# Patient Record
Sex: Female | Born: 1935 | Race: White | Hispanic: No | Marital: Married | Smoking: Former smoker
Health system: Southern US, Community
[De-identification: ages and names within clinical notes are randomized; demographics above are authoritative.]

## PROBLEM LIST (undated history)

## (undated) DIAGNOSIS — M8580 Other specified disorders of bone density and structure, unspecified site: Secondary | ICD-10-CM

## (undated) DIAGNOSIS — IMO0001 Reserved for inherently not codable concepts without codable children: Secondary | ICD-10-CM

## (undated) DIAGNOSIS — I10 Essential (primary) hypertension: Secondary | ICD-10-CM

## (undated) DIAGNOSIS — I272 Pulmonary hypertension, unspecified: Secondary | ICD-10-CM

## (undated) DIAGNOSIS — H9193 Unspecified hearing loss, bilateral: Secondary | ICD-10-CM

## (undated) DIAGNOSIS — N6459 Other signs and symptoms in breast: Secondary | ICD-10-CM

## (undated) DIAGNOSIS — I872 Venous insufficiency (chronic) (peripheral): Secondary | ICD-10-CM

## (undated) DIAGNOSIS — I340 Nonrheumatic mitral (valve) insufficiency: Secondary | ICD-10-CM

## (undated) DIAGNOSIS — J4520 Mild intermittent asthma, uncomplicated: Secondary | ICD-10-CM

## (undated) DIAGNOSIS — IMO0002 Reserved for concepts with insufficient information to code with codable children: Secondary | ICD-10-CM

## (undated) DIAGNOSIS — I447 Left bundle-branch block, unspecified: Secondary | ICD-10-CM

## (undated) DIAGNOSIS — K56609 Unspecified intestinal obstruction, unspecified as to partial versus complete obstruction: Secondary | ICD-10-CM

## (undated) DIAGNOSIS — N6019 Diffuse cystic mastopathy of unspecified breast: Secondary | ICD-10-CM

## (undated) DIAGNOSIS — E7800 Pure hypercholesterolemia, unspecified: Secondary | ICD-10-CM

## (undated) HISTORY — PX: COLON SURGERY: SHX602

## (undated) HISTORY — DX: Venous insufficiency (chronic) (peripheral): I87.2

## (undated) HISTORY — DX: Other signs and symptoms in breast: N64.59

## (undated) HISTORY — DX: Pure hypercholesterolemia, unspecified: E78.00

## (undated) HISTORY — DX: Mild intermittent asthma, uncomplicated: J45.20

## (undated) HISTORY — DX: Reserved for concepts with insufficient information to code with codable children: IMO0002

## (undated) HISTORY — DX: Nonrheumatic mitral (valve) insufficiency: I34.0

## (undated) HISTORY — DX: Diffuse cystic mastopathy of unspecified breast: N60.19

## (undated) HISTORY — DX: Left bundle-branch block, unspecified: I44.7

## (undated) HISTORY — PX: HERNIA REPAIR: SHX51

## (undated) HISTORY — DX: Reserved for inherently not codable concepts without codable children: IMO0001

## (undated) HISTORY — DX: Essential (primary) hypertension: I10

## (undated) HISTORY — DX: Other specified disorders of bone density and structure, unspecified site: M85.80

## (undated) HISTORY — DX: Unspecified intestinal obstruction, unspecified as to partial versus complete obstruction: K56.609

## (undated) HISTORY — DX: Unspecified hearing loss, bilateral: H91.93

## (undated) HISTORY — DX: Pulmonary hypertension, unspecified: I27.20

---

## 1997-12-17 HISTORY — PX: BREAST EXCISIONAL BIOPSY: SUR124

## 1998-04-11 ENCOUNTER — Other Ambulatory Visit: Admission: RE | Admit: 1998-04-11 | Discharge: 1998-04-11 | Payer: Self-pay | Admitting: Family Medicine

## 1998-07-11 ENCOUNTER — Ambulatory Visit (HOSPITAL_COMMUNITY): Admission: RE | Admit: 1998-07-11 | Discharge: 1998-07-11 | Payer: Self-pay

## 2000-08-05 ENCOUNTER — Encounter: Payer: Self-pay | Admitting: Family Medicine

## 2000-08-05 ENCOUNTER — Encounter: Admission: RE | Admit: 2000-08-05 | Discharge: 2000-08-05 | Payer: Self-pay | Admitting: Family Medicine

## 2001-07-31 ENCOUNTER — Encounter: Admission: RE | Admit: 2001-07-31 | Discharge: 2001-07-31 | Payer: Self-pay | Admitting: Family Medicine

## 2001-07-31 ENCOUNTER — Encounter: Payer: Self-pay | Admitting: Family Medicine

## 2001-11-19 ENCOUNTER — Encounter: Payer: Self-pay | Admitting: Family Medicine

## 2001-11-19 ENCOUNTER — Encounter: Admission: RE | Admit: 2001-11-19 | Discharge: 2001-11-19 | Payer: Self-pay | Admitting: Family Medicine

## 2001-12-05 ENCOUNTER — Ambulatory Visit (HOSPITAL_COMMUNITY): Admission: RE | Admit: 2001-12-05 | Discharge: 2001-12-05 | Payer: Self-pay | Admitting: Urology

## 2001-12-05 ENCOUNTER — Encounter: Payer: Self-pay | Admitting: Urology

## 2001-12-17 HISTORY — PX: NEPHRECTOMY: SHX65

## 2001-12-17 HISTORY — PX: CHOLECYSTECTOMY: SHX55

## 2001-12-17 HISTORY — PX: COLECTOMY: SHX59

## 2001-12-17 HISTORY — PX: APPENDECTOMY: SHX54

## 2001-12-23 ENCOUNTER — Encounter (INDEPENDENT_AMBULATORY_CARE_PROVIDER_SITE_OTHER): Payer: Self-pay | Admitting: Specialist

## 2001-12-23 ENCOUNTER — Ambulatory Visit (HOSPITAL_COMMUNITY): Admission: RE | Admit: 2001-12-23 | Discharge: 2001-12-23 | Payer: Self-pay | Admitting: Gastroenterology

## 2001-12-23 ENCOUNTER — Encounter (INDEPENDENT_AMBULATORY_CARE_PROVIDER_SITE_OTHER): Payer: Self-pay

## 2001-12-26 ENCOUNTER — Encounter: Payer: Self-pay | Admitting: Surgery

## 2001-12-29 ENCOUNTER — Encounter: Admission: RE | Admit: 2001-12-29 | Discharge: 2001-12-29 | Payer: Self-pay | Admitting: Surgery

## 2001-12-29 ENCOUNTER — Encounter: Payer: Self-pay | Admitting: Surgery

## 2002-01-02 ENCOUNTER — Inpatient Hospital Stay (HOSPITAL_COMMUNITY): Admission: RE | Admit: 2002-01-02 | Discharge: 2002-01-09 | Payer: Self-pay | Admitting: Surgery

## 2002-01-02 ENCOUNTER — Encounter (INDEPENDENT_AMBULATORY_CARE_PROVIDER_SITE_OTHER): Payer: Self-pay

## 2002-08-04 ENCOUNTER — Encounter: Admission: RE | Admit: 2002-08-04 | Discharge: 2002-08-04 | Payer: Self-pay | Admitting: Family Medicine

## 2002-08-04 ENCOUNTER — Encounter: Payer: Self-pay | Admitting: Family Medicine

## 2002-12-17 HISTORY — PX: CATARACT EXTRACTION: SUR2

## 2003-08-06 ENCOUNTER — Encounter: Admission: RE | Admit: 2003-08-06 | Discharge: 2003-08-06 | Payer: Self-pay | Admitting: Family Medicine

## 2003-08-06 ENCOUNTER — Encounter: Payer: Self-pay | Admitting: Family Medicine

## 2004-03-17 ENCOUNTER — Other Ambulatory Visit: Admission: RE | Admit: 2004-03-17 | Discharge: 2004-03-17 | Payer: Self-pay | Admitting: Obstetrics and Gynecology

## 2004-08-07 ENCOUNTER — Ambulatory Visit (HOSPITAL_COMMUNITY): Admission: RE | Admit: 2004-08-07 | Discharge: 2004-08-07 | Payer: Self-pay | Admitting: Family Medicine

## 2005-02-19 ENCOUNTER — Encounter: Admission: RE | Admit: 2005-02-19 | Discharge: 2005-02-19 | Payer: Self-pay | Admitting: Family Medicine

## 2005-08-08 ENCOUNTER — Ambulatory Visit (HOSPITAL_COMMUNITY): Admission: RE | Admit: 2005-08-08 | Discharge: 2005-08-08 | Payer: Self-pay | Admitting: Family Medicine

## 2005-12-26 ENCOUNTER — Encounter: Admission: RE | Admit: 2005-12-26 | Discharge: 2005-12-26 | Payer: Self-pay | Admitting: Family Medicine

## 2006-08-09 ENCOUNTER — Ambulatory Visit (HOSPITAL_COMMUNITY): Admission: RE | Admit: 2006-08-09 | Discharge: 2006-08-09 | Payer: Self-pay | Admitting: Family Medicine

## 2006-11-01 ENCOUNTER — Other Ambulatory Visit: Admission: RE | Admit: 2006-11-01 | Discharge: 2006-11-01 | Payer: Self-pay | Admitting: Obstetrics and Gynecology

## 2006-11-28 ENCOUNTER — Encounter: Admission: RE | Admit: 2006-11-28 | Discharge: 2006-11-28 | Payer: Self-pay | Admitting: Family Medicine

## 2007-08-11 ENCOUNTER — Ambulatory Visit (HOSPITAL_COMMUNITY): Admission: RE | Admit: 2007-08-11 | Discharge: 2007-08-11 | Payer: Self-pay | Admitting: Family Medicine

## 2007-10-02 HISTORY — PX: CARDIAC CATHETERIZATION: SHX172

## 2008-07-20 ENCOUNTER — Encounter: Admission: RE | Admit: 2008-07-20 | Discharge: 2008-07-20 | Payer: Self-pay | Admitting: Orthopedic Surgery

## 2008-08-11 ENCOUNTER — Ambulatory Visit (HOSPITAL_COMMUNITY): Admission: RE | Admit: 2008-08-11 | Discharge: 2008-08-11 | Payer: Self-pay | Admitting: Family Medicine

## 2009-08-12 ENCOUNTER — Ambulatory Visit (HOSPITAL_COMMUNITY): Admission: RE | Admit: 2009-08-12 | Discharge: 2009-08-12 | Payer: Self-pay | Admitting: Family Medicine

## 2009-09-06 ENCOUNTER — Ambulatory Visit: Payer: Self-pay | Admitting: Obstetrics and Gynecology

## 2009-09-06 ENCOUNTER — Encounter: Payer: Self-pay | Admitting: Obstetrics and Gynecology

## 2009-09-06 ENCOUNTER — Other Ambulatory Visit: Admission: RE | Admit: 2009-09-06 | Discharge: 2009-09-06 | Payer: Self-pay | Admitting: Obstetrics and Gynecology

## 2009-09-26 ENCOUNTER — Ambulatory Visit: Payer: Self-pay | Admitting: Obstetrics and Gynecology

## 2010-02-15 ENCOUNTER — Ambulatory Visit: Payer: Self-pay | Admitting: Obstetrics and Gynecology

## 2010-05-02 ENCOUNTER — Encounter: Admission: RE | Admit: 2010-05-02 | Discharge: 2010-05-02 | Payer: Self-pay | Admitting: Family Medicine

## 2010-08-14 ENCOUNTER — Ambulatory Visit (HOSPITAL_COMMUNITY): Admission: RE | Admit: 2010-08-14 | Discharge: 2010-08-14 | Payer: Self-pay | Admitting: Family Medicine

## 2010-09-12 ENCOUNTER — Ambulatory Visit: Payer: Self-pay | Admitting: Obstetrics and Gynecology

## 2011-01-17 HISTORY — PX: TRANSTHORACIC ECHOCARDIOGRAM: SHX275

## 2011-01-17 HISTORY — PX: NM MYOCAR PERF WALL MOTION: HXRAD629

## 2011-02-21 ENCOUNTER — Other Ambulatory Visit: Payer: Self-pay | Admitting: Family Medicine

## 2011-02-21 DIAGNOSIS — R0989 Other specified symptoms and signs involving the circulatory and respiratory systems: Secondary | ICD-10-CM

## 2011-02-22 ENCOUNTER — Ambulatory Visit
Admission: RE | Admit: 2011-02-22 | Discharge: 2011-02-22 | Disposition: A | Discharge status: Home / Self Care | Payer: Medicare Other | Source: Ambulatory Visit | Attending: Family Medicine | Admitting: Family Medicine

## 2011-02-22 DIAGNOSIS — R0989 Other specified symptoms and signs involving the circulatory and respiratory systems: Secondary | ICD-10-CM

## 2011-03-08 ENCOUNTER — Other Ambulatory Visit: Payer: Self-pay | Admitting: Orthopedic Surgery

## 2011-03-08 ENCOUNTER — Encounter (HOSPITAL_COMMUNITY): Payer: Medicare Other

## 2011-03-08 ENCOUNTER — Other Ambulatory Visit (HOSPITAL_COMMUNITY): Payer: Self-pay | Admitting: Orthopedic Surgery

## 2011-03-08 ENCOUNTER — Ambulatory Visit (HOSPITAL_COMMUNITY)
Admission: RE | Admit: 2011-03-08 | Discharge: 2011-03-08 | Disposition: A | Discharge status: Home / Self Care | Payer: Medicare Other | Source: Ambulatory Visit | Attending: Orthopedic Surgery | Admitting: Orthopedic Surgery

## 2011-03-08 DIAGNOSIS — Z79899 Other long term (current) drug therapy: Secondary | ICD-10-CM | POA: Insufficient documentation

## 2011-03-08 DIAGNOSIS — M069 Rheumatoid arthritis, unspecified: Secondary | ICD-10-CM | POA: Insufficient documentation

## 2011-03-08 DIAGNOSIS — H269 Unspecified cataract: Secondary | ICD-10-CM | POA: Insufficient documentation

## 2011-03-08 DIAGNOSIS — M1710 Unilateral primary osteoarthritis, unspecified knee: Secondary | ICD-10-CM | POA: Insufficient documentation

## 2011-03-08 DIAGNOSIS — I517 Cardiomegaly: Secondary | ICD-10-CM | POA: Insufficient documentation

## 2011-03-08 DIAGNOSIS — M25569 Pain in unspecified knee: Secondary | ICD-10-CM | POA: Insufficient documentation

## 2011-03-08 DIAGNOSIS — I4891 Unspecified atrial fibrillation: Secondary | ICD-10-CM | POA: Insufficient documentation

## 2011-03-08 DIAGNOSIS — Z01811 Encounter for preprocedural respiratory examination: Secondary | ICD-10-CM

## 2011-03-08 DIAGNOSIS — E785 Hyperlipidemia, unspecified: Secondary | ICD-10-CM | POA: Insufficient documentation

## 2011-03-08 DIAGNOSIS — I10 Essential (primary) hypertension: Secondary | ICD-10-CM | POA: Insufficient documentation

## 2011-03-08 LAB — URINALYSIS, ROUTINE W REFLEX MICROSCOPIC
Bilirubin Urine: NEGATIVE
Hgb urine dipstick: NEGATIVE
Nitrite: NEGATIVE
Protein, ur: NEGATIVE mg/dL
Urobilinogen, UA: 0.2 mg/dL (ref 0.0–1.0)

## 2011-03-08 LAB — COMPREHENSIVE METABOLIC PANEL
CO2: 27 mEq/L (ref 19–32)
Calcium: 9.6 mg/dL (ref 8.4–10.5)
Creatinine, Ser: 0.85 mg/dL (ref 0.4–1.2)
GFR calc Af Amer: >60 mL/min (ref >60)
GFR calc non Af Amer: >60 mL/min (ref >60)
Glucose, Bld: 104 mg/dL — ABNORMAL HIGH (ref 70–99)
Total Protein: 7.5 g/dL (ref 6.0–8.3)

## 2011-03-08 LAB — SURGICAL PCR SCREEN: Staphylococcus aureus: NEGATIVE

## 2011-03-08 LAB — PROTIME-INR: Prothrombin Time: 13.6 seconds (ref 11.6–15.2)

## 2011-03-08 LAB — APTT: aPTT: 36 seconds (ref 24–37)

## 2011-03-08 LAB — CBC
HCT: 39.8 % (ref 36.0–46.0)
MCHC: 33.2 g/dL (ref 30.0–36.0)
Platelets: 307 10*3/uL (ref 150–400)
RDW: 13.3 % (ref 11.5–15.5)
WBC: 6.2 10*3/uL (ref 4.0–10.5)

## 2011-03-16 ENCOUNTER — Inpatient Hospital Stay (HOSPITAL_COMMUNITY)
Admission: RE | Admit: 2011-03-16 | Discharge: 2011-03-19 | DRG: 470 | Disposition: A | Discharge status: Skilled Nursing Facility | Payer: Medicare Other | Source: Ambulatory Visit | Attending: Orthopedic Surgery | Admitting: Orthopedic Surgery

## 2011-03-16 DIAGNOSIS — E785 Hyperlipidemia, unspecified: Secondary | ICD-10-CM | POA: Diagnosis present

## 2011-03-16 DIAGNOSIS — M069 Rheumatoid arthritis, unspecified: Secondary | ICD-10-CM | POA: Diagnosis present

## 2011-03-16 DIAGNOSIS — M1710 Unilateral primary osteoarthritis, unspecified knee: Principal | ICD-10-CM | POA: Diagnosis present

## 2011-03-16 DIAGNOSIS — D62 Acute posthemorrhagic anemia: Secondary | ICD-10-CM | POA: Diagnosis not present

## 2011-03-16 DIAGNOSIS — I4891 Unspecified atrial fibrillation: Secondary | ICD-10-CM | POA: Diagnosis present

## 2011-03-16 DIAGNOSIS — I10 Essential (primary) hypertension: Secondary | ICD-10-CM | POA: Diagnosis present

## 2011-03-16 DIAGNOSIS — Z01812 Encounter for preprocedural laboratory examination: Secondary | ICD-10-CM

## 2011-03-16 HISTORY — PX: TOTAL KNEE ARTHROPLASTY: SHX125

## 2011-03-17 LAB — CBC
HCT: 33.2 % — ABNORMAL LOW (ref 36.0–46.0)
Hemoglobin: 10.8 g/dL — ABNORMAL LOW (ref 12.0–15.0)
MCH: 31.5 pg (ref 26.0–34.0)
MCV: 96.8 fL (ref 78.0–100.0)
RBC: 3.43 MIL/uL — ABNORMAL LOW (ref 3.87–5.11)

## 2011-03-17 LAB — BASIC METABOLIC PANEL
BUN: 13 mg/dL (ref 6–23)
CO2: 26 mEq/L (ref 19–32)
Chloride: 105 mEq/L (ref 96–112)
Creatinine, Ser: 0.74 mg/dL (ref 0.4–1.2)
Glucose, Bld: 138 mg/dL — ABNORMAL HIGH (ref 70–99)

## 2011-03-18 LAB — BASIC METABOLIC PANEL
BUN: 9 mg/dL (ref 6–23)
Chloride: 106 mEq/L (ref 96–112)
Creatinine, Ser: 0.72 mg/dL (ref 0.4–1.2)

## 2011-03-18 LAB — CBC
MCH: 31.5 pg (ref 26.0–34.0)
MCV: 95.7 fL (ref 78.0–100.0)
Platelets: 247 10*3/uL (ref 150–400)
RBC: 3.24 MIL/uL — ABNORMAL LOW (ref 3.87–5.11)

## 2011-03-19 LAB — CBC
MCH: 30.9 pg (ref 26.0–34.0)
MCV: 95.2 fL (ref 78.0–100.0)
Platelets: 259 10*3/uL (ref 150–400)
RDW: 13.1 % (ref 11.5–15.5)
WBC: 11.1 10*3/uL — ABNORMAL HIGH (ref 4.0–10.5)

## 2011-03-19 NOTE — H&P (Signed)
NAME:  Rhonda Burke, Rhonda Burke            ACCOUNT NO.:  [id number]  MEDICAL RECORD NO.:  [id number]           PATIENT TYPE:  O  LOCATION:  XRAY                         FACILITY:  [facility]  PHYSICIAN:  Rozell Searing, [facility]    DATE OF BIRTH:  04/16/1927  DATE OF ADMISSION:  03/08/2011 DATE OF DISCHARGE:  03/08/2011                             HISTORY & PHYSICAL   CHIEF COMPLAINT:  Left knee pain.  BRIEF HISTORY:  Rhonda Burke came in to see Dr. Lequita Halt as a second opinion back in December.  She is a very pleasant 77 year old female. She has had a long history of problems related to the left knee in particular.  She does not have a tremendous amount of pain, however, she does have worsening dysfunction.  She states when she tries to walk the knee feels as though will give out on her.  She and her husband is very concerned about the possibility of her falling.  She has tried conservative measures such as cortisone injection as well as Viscoat supplementation.  She did have good results of cortisone at first, but these are no longer giving her relief and she had no relief with Viscoat supplementation.  She now presents for left total knee arthroplasty.  MEDICATION ALLERGIES:  No known drug allergies.  PRIMARY CARE PHYSICIAN:  Dr. Mosetta Putt and the cardiologist is Dr. Nicki Guadalajara.  CURRENT MEDICATIONS: 1. Carvedilol. 2. Lisinopril. 3. Simvastatin.  PAST MEDICAL HISTORY: 1. End-stage arthritis of left knee. 2. Cataracts. 3. Dentures. 4. Hypertension. 5. Hyperlipidemia. 6. Atrial fibrillation. 7. Rheumatoid arthritis.  PAST SURGICAL HISTORY: 1. Removal of kidney in 2003. 2. Cholecystectomy. 3. Partial colectomy and appendectomy in 2003.  FAMILY HISTORY:  Father passed at age of 68.  He had a CVA.  Mother passed at age of 83 also CVA.  SOCIAL HISTORY:  The patient is married.  She admits past use of tobacco products many years ago.  She states she has an occasional glass of  wine with dinner.  She has 3 children.  She lives with her husband.  She would like to go to [facility] following her hospital stay.  REVIEW OF SYSTEMS:  GENERAL:  Negative for fever, chills, or weight change.  HEENT:  Negative for headache or blurred vision.  DERMATOLOGIC: Negative for rash or lesion.  RESPIRATORY:  Negative for shortness of breath at rest or with exertion.  CARDIOVASCULAR:  Negative for chest pain or palpitations.  GI:  Negative for nausea, vomiting, and diarrhea. GU:  Negative for hematuria or dysuria.  MUSCULOSKELETAL:  Positive for joint swelling and stiffness.  PHYSICAL EXAMINATION:  VITAL SIGNS:  Pulse 84, respirations 18, and blood pressure 140/84 in the left arm.  She has a stated height of 5 feet 4 inches and stated weight of 174 pounds. GENERAL:  Rhonda Burke is a very pleasant 77 year old female.  She is alert and oriented x3.  She is in no apparent distress.  She is accompanied today by her husband and her daughter. HEENT:  Normocephalic and atraumatic.  Extraocular movements intact. NECK:  Supple.  Full range of motion without lymphadenopathy. CHEST:  Lungs are  clear to auscultation bilaterally without wheezes, rhonchi, or rales. HEART:  Regular rate and rhythm without murmur.  S1 and S2 are appreciated. ABDOMEN:  Bowel sounds present in all 4 quadrants. EXTREMITIES:  Left knee negative for effusion.  Range of motion is 5 to 125 degrees moderate crepitus throughout the range.  Tenderness to palpation over the medial joint line.  No instability is noted. NEUROLOGIC:  Intact in the lower extremities bilaterally.  Peripheral vascular carotid pulses 2+ bilaterally without bruit.  RADIOGRAPHS:  AP and lateral views of the left knee reveal that she has bone-on-bone in both the medial and patellofemoral compartments.  IMPRESSION:  End-stage arthritis of the left knee.  PLAN:  Left total knee arthroplasty to be performed by Dr. Lequita Halt.     Rozell Searing, [facility]     LD/MEDQ  D:  03/18/2011  T:  03/18/2011  Job:  [id number]  Electronically Signed by Rozell Searing  on 03/19/2011 12:41:55 PM

## 2011-03-20 NOTE — Op Note (Signed)
NAME:  Rhonda Burke, Rhonda Burke            ACCOUNT NO.:  [id number]  MEDICAL RECORD NO.:  [id number]           PATIENT TYPE:  I  LOCATION:  0002                         FACILITY:  [facility]  PHYSICIAN:  Ollen Gross, M.D.    DATE OF BIRTH:  10-26-1927  DATE OF PROCEDURE:  03/16/2011 DATE OF DISCHARGE:  03/08/2011                              OPERATIVE REPORT   PREOPERATIVE DIAGNOSIS:  Osteoarthritis, left knee.  POSTOPERATIVE DIAGNOSIS:  Osteoarthritis, left knee.  PROCEDURE:  Left total knee arthroplasty.  SURGEON:  Ollen Gross, M.D.  ASSISTANT:  Alexzandrew L. Perkins, P.A.C.  ANESTHESIA:  Spinal.  ESTIMATED BLOOD LOSS:  Minimal.  DRAINS:  Hemovac x1.  TOURNIQUET TIME:  37 minutes at 300 mmHg.  COMPLICATIONS:  None.  CONDITION:  Stable to recovery room.  CLINICAL NOTE:  Ms. Gadberry is an 77 year old female with advanced end- stage arthritis of the left knee with progressively worsening pain and dysfunction.  She has failed nonoperative management and presents for total knee arthroplasty.  PROCEDURE IN DETAIL:  After successful administration of spinal anesthetic, a tourniquet was placed high on her left thigh and her left lower extremity was prepped and draped in usual sterile fashion. Extremity was wrapped in Esmarch, knee flexed, tourniquet inflated to 300 mmHg.  Midline incision was made with a 10 blade through subcutaneous tissues to the level of the extensor mechanism.  Fresh blade was used make a medial parapatellar arthrotomy.  Soft tissue on the proximal medial tibia subperiosteally elevated to the joint line with the knife into the semimembranosus bursa with a Cobb elevator. Soft tissue laterally was elevated with attention being paid to avoiding the patellar tendon on tibial tubercle.  Patella was everted, knee flexed 90 degrees and ACL and PCL removed.  Drill was used to create a starting hole in the distal femur and canal was thoroughly irrigated. The  5-degree left valgus alignment guide was placed.  Distal femoral cutting block was placed and was pinned to remove 11 mm off the distal femur.  Distal femoral resection was made with an oscillating saw.  Tibia subluxed forward and the menisci were removed.  Extramedullary tibial alignment guide was placed referencing proximally at the medial aspect of the tibial tubercle and distally along the second metatarsal axis and tibial crest.  The block was pinned to remove 2 mm off the more deficient medial side.  Tibial resection was made with an oscillating saw.  Size 3 was the most appropriate tibial component and proximal tibia was prepared to modular drill and keel punch for the size 3.  Femoral sizing guide was placed, size 3 was most appropriate.  Rotations marked off the epicondylar axis and confirmed by creating a rectangular flexion gap at 90 degrees.  The block was pinned in this rotation and the anterior-posterior chamfer cuts were made.  Intercondylar blocks placed in that cuts made.  Trial size 3 posterior stabilized femur was placed.  A 12.5-mm posterior stabilized rotating platform insert trial was placed.  A 12.5 full extension was achieved with excellent varus- valgus anterior-posterior balance throughout full range of motion.  The patella was everted and thickness measured to  be 22 mm.  Freehand resection was taken to 12 mm, 35 template was placed, lug holes were drilled, trial patella was placed and it tracks normally.  Osteophytes were removed off the posterior femur with the trial in place.  All trials were removed and the cut bone surfaces were prepared with pulsatile lavage.  Cement was mixed and once ready for implantation, the size 3 mobile bearing tibial tray size 3 posterior stabilized femur and 35 patella were cemented into place and patella was held with a clamp. Trial 12.5-mm inserts placed, knee held in full extension, and all extruded cement removed.  With the  cement fully hardened, a permanent 12.5-mm posterior stabilized rotating platform insert was placed in a tibial tray.  Wound was copiously irrigated with saline solution and the arthrotomy closed over Hemovac drain with interrupted #1 PDS.  Flexion against gravity was 135 degrees and the patella tracks normally.  The wound was then copiously irrigated with saline solution and the arthrotomy closed over Hemovac drain with interrupted #1 PDS.  Flexion against gravity was 135 degrees.  Tourniquet was then released for a total tourniquet time of 37 minutes.  Subcu was closed with interrupted 2-0 Vicryl and subcuticular running 4-0 Monocryl.  Incision was cleaned and dried and Steri-Strips and bulky sterile dressing were applied.  She was then placed into a knee immobilizer, awakened and transferred to recovery in stable condition.     Ollen Gross, M.D.     FA/MEDQ  D:  03/16/2011  T:  03/16/2011  Job:  [id number]  Electronically Signed by Ollen Gross M.D. on 03/20/2011 07:15:40 AM

## 2011-03-21 NOTE — Discharge Summary (Signed)
NAME:  Rhonda Burke, Rhonda Burke            ACCOUNT NO.:  [id number]  MEDICAL RECORD NO.:  [id number]           PATIENT TYPE:  I  LOCATION:  1613                         FACILITY:  [facility]  PHYSICIAN:  Ollen Gross, M.D.    DATE OF BIRTH:  06-Nov-1927  DATE OF ADMISSION:  03/16/2011 DATE OF DISCHARGE:                        DISCHARGE SUMMARY - REFERRING   1.  ADMITTING DIAGNOSES: 1. Osteoarthritis, left knee. 2. Cataracts. 3. Hypertension. 4. Hyperlipidemia. 5. Atrial fibrillation. 6. Rheumatoid arthritis.  DISCHARGE DIAGNOSES: 1. Osteoarthritis, left knee, status post left total knee replacement     arthroplasty. 2. Postop acute blood loss anemia, did not require transfusion. 3. Cataracts. 4. Hypertension. 5. Hyperlipidemia. 6. Atrial fibrillation. 7. Rheumatoid arthritis.  PROCEDURE:  Left total knee.  Surgeon, Dr. Lequita Halt.  Assistant, Alexzandrew L. Perkins, P.A.C.  Spinal anesthesia.  Tourniquet time, 37 minutes.  CONSULTS:  None.  BRIEF HISTORY:  The patient is an 77 year old female known to Dr. Lequita Halt for ongoing progressive end-stage arthritis to left knee.  She has failed nonoperative management and now presents for total knee arthroplasty.  LABORATORY DATA:  CBC on admission; hemoglobin 13.2, hematocrit 39.8, white cell count 6.2, platelets 307.  PT/INR 13.6 and 1.02 with PTT of 36.  Chem panel on admission all within normal limits.  Preop UA was negative.  Blood group type A+.  Nasal swabs were negative.  Staph aureus negative for MRSA.  Serial CBCs were followed.  Hemoglobin dropped down to 10.8 and 10.2.  Last H and H of 9.6 and 29.6.  Serial BMETs were followed for 48 hours.  Electrolytes remained within normal limits.  X-rays, 2-view chest, March 08, 2011, cardiomegaly, no acute cardiopulmonary abnormalities.  EKG, March 16, 2011, sinus bradycardia, abnormal EKG, left bundle-branch block, unconfirmed.  HOSPITAL COURSE:  The patient admitted to [facility], taken to OR, underwent above-stated procedure without complication.  The patient tolerated the procedure well, later transferred to the recovery room on orthopedic floor, started on p.o. and IV analgesic pain control following surgery.  Doing pretty well.  Was tired on morning of day #1. Hemoglobin was 10.8.  Hemovac drain was removed without difficulty.  She was started on PCA and then weaned over to p.o. meds.  She was started on Xarelto for DVT prophylaxis.  She did got up a little bit and walked to the room on day #1 and then on postop day #2 she was doing very well, walking to 160 feet.  Dressing changed.  Incision looked good.  Pain pump placed at time of surgery was removed on day #2.  She had been weaned over to p.o. meds.  Encouraged to increase her activity.  By day #3, she was progressing.  Social work had been consulted postop because she wanted to look into [employer].  It was felt that she would be a good candidate.  She was seen on Monday and family stated that they were supposed to have a bed for her, arrangements being made, summary dictated and she will be transferred over to [facility].  DISCHARGE/PLAN: 1. The patient will be transferred over to [facility] now on March 19, 2011. 2. Discharge diagnoses, please see above.  DISCHARGE MEDICATIONS:  Current medications at time of transfer include: 1. Aspirin 81 mg daily. 2. Zocor 40 mg p.o. daily. 3. Coreg 6.25 mg twice a day 4. Xarelto 10 mg p.o. daily for 14 days and then discontinue the     Xarelto. 5. Colace 100 mg p.o. b.i.d. 6. Tylenol 325 one or two every 4 to 6 hours as needed for mild pain,     temperature or headache. 7. Laxative of choice. 8. Enema of choice. 9. Robaxin 500 mg p.o. q.6-8 h p.r.n. spasm. 10.Vicodin 5 mg 1 or 2 every 4 to 6 hours as needed for moderate pain.  DIET:  Heart-healthy diet.  ACTIVITY:  She is weightbearing as tolerated.  Total knee protocol.  PT and OT  for gait training, ambulation, ADLs, range of motion and strengthening exercises.  FOLLOWUP:  She is to follow up Dr. Lequita Halt in the office 2 weeks from the surgery.  Please contact the office at [phone] to help arrange appointment, follow-up care and transfer this patient over to Dr. Lequita Halt office.  Please note the patient may start showering once she is transferred over to [facility].  Do not submerge incision under water but she may start showering.  DISPOSITION:  [place]  CONDITION ON DISCHARGE:  Improving.     Alexzandrew L. Julien Girt, P.A.C.   ______________________________ Ollen Gross, M.D.    ALP/MEDQ  D:  03/19/2011  T:  03/19/2011  Job:  [id number]  cc:   Mosetta Putt, M.D. Fax: [phone]  Nicki Guadalajara, M.D. Fax: [phone]  Electronically Signed by Patrica Duel P.A.C. on 03/21/2011 07:16:29 AM Electronically Signed by Ollen Gross M.D. on 03/21/2011 09:53:53 AM

## 2011-05-04 NOTE — Op Note (Signed)
[facility]  Patient:    Rhonda Burke, Rhonda Burke [facility] Visit Number: [id number] MRN: [id number]          Service Type: SUR Location: 3W 0348 01 Attending Physician:  Charlton Haws Dictated by:   Currie Paris, M.D. Proc. Date: 01/02/02 Admit Date:  01/02/2002   CC:         Maretta Bees. Vonita Moss, M.D.  Griffith Citron, M.D.   Operative Report  PREOPERATIVE DIAGNOSES:  1. Descending colon lesion probably carcinoma  2. Gallstones.  3. Right renal tumor.  POSTOPERATIVE DIAGNOSES:  1. Inflammatory stricture of sigmoid colon.  2. Gallstones.  3. Renal tumor.  OPERATION PERFORMED:  Sigmoid colectomy with cholecystectomy and incidental appendectomy.  SURGEON:  Currie Paris, M.D.  ANESTHESIA:  General endotracheal.  CLINICAL HISTORY:  This patient is a 77 year old who developed some GI disturbance and on evaluation with a CT scan was thought possibly to have some diverticulitis but also to have a renal tumor in the right kidney. Further evaluation of the lesion in the colon looked like an inflammatory mass and was highly suggestive by colonoscopy of tumor but biopsies just showed some inflammatory changes. As part of a workup, incidental large gallstone was noted. The patient had already been seen by Dr. Vonita Moss and was scheduled for right nephrectomy and we elected to proceed with resection of the colon lesion as well as cholecystectomy.  DESCRIPTION OF PROCEDURE:  The patient was seen in the holding area and had no further questions. An epidural postoperatively was planned, discussed with her and anesthesia. She was taken to the operating room and after satisfactory general endotracheal anesthesia had been obtained, the abdomen was prepped and draped. A long midline incision from just below the xiphoid to just above the pubis was made and the peritoneal cavity entered. A wound protector was placed and a self retaining retractor  was placed. There was a hard mass of the sigmoid colon that was stuck to the edge of the bladder. This appeared to be the area in question. The colon was a little bit dilated proximally consistent with some proximal obstruction but there was also air distal to that. The remaining colon was palpated and was normal. The appendix was a little bit retrocecal. The small bowel was normal. There was a gallstone in the gallbladder. A couple of small hepatic cysts were present. There appeared to be a soft tumor of the kidney but this was more difficult to feel. The uterus, tubes and ovaries were normal.  The sigmoid colon was mobilized up and once I decided to resect this, I picked some areas proximally and distally where the colon appeared to be healthy and I was well around this in case this was malignant. I made a window in the mesentery, divided the mesentery down towards the base and then back up towards the spot distal to the tumor where I was planning to do the resection. Allen clamps were placed on the stay side at both ends and Kochers on the side to be resected and the bowel divided. We had a good prep. Anastomosis was done with an end-to-end stapled anastomosis using a triangulation technique and three firings. This appeared to produce a nice lumen with no tension and very healthy with some bleeding edges noted.  The mesenteric defect was closed with some 2-0 silks. The ureter was identified and noted to be intact on the left side.  I elected to go ahead and do an incidental appendectomy since  the appendix was a little bit retrocecal, mobilized this up, divided the mesentery with some clamps, ligated it with 2-0 silk down to the base. The base was clamped with a clamp, ligated with an #0 chromic and amputated. A 3-0 silk pursestring was used to put in inverting suture ______ the tip.  Attention was turned to the gallbladder. Some clamps were placed on this, the peritoneum over the  cystic duct opened. I identified the cystic duct, dissected it out, could see its junction with the gallbladder and with the common duct. It was clipped with three clips on the stay side and divided. The cystic artery both the anterior and posterior branch were dissected out, clipped and divided and the gallbladder removed from below to Bovie coagulation of the cautery. I irrigated and made sure this was dry and we also irrigated the pelvis.  At this point, Dr. Vonita Moss came in and I assisted him as he did a right radical nephrectomy. At the conclusion of that, we made another irrigation of all areas that had been operated and inspected them to be sure everything was dry and everything appeared okay.  The abdomen was closed with a running #1 PDS on the fascia, the wound was irrigated and the skin closed with staples. The patient tolerated the procedure well. There were no operative complications. All counts were correct. Dictated by:   Currie Paris, M.D. Attending Physician:  Charlton Haws DD:  01/02/02 TD:  01/04/02 Job: [id number]

## 2011-05-04 NOTE — Op Note (Signed)
[facility]  Patient:    Rhonda Burke, Rhonda Burke [facility] Visit Number: [id number] MRN: [id number]          Service Type: SUR Location: 3W 0348 01 Attending Physician:  Charlton Haws Dictated by:   Maretta Bees. Vonita Moss, M.D. Proc. Date: 01/02/02 Admit Date:  01/02/2002   CC:         Currie Paris, M.D.  [name], M.D.  Griffith Citron, M.D.   Operative Report  PREOPERATIVE DIAGNOSIS:  Right renal cell carcinoma.  POSTOPERATIVE DIAGNOSIS:  Right renal cell carcinoma.  PROCEDURE:  Right radical nephrectomy.  SURGEON:  Maretta Bees. Vonita Moss, M.D.  ASSISTANT:  Currie Paris, M.D.  INDICATIONS FOR PROCEDURE:  This 77 year old white female had a CT scan done when she developed some GI difficulty and a 3.5 x 3.0 cm hypervascular lesion was seen in the lower aspect of the right kidney. It was suspicious for renal cell carcinoma. There was a smaller lesion in the lower pole of the left kidney that was not clearly a cyst that only measured 13 mm in size. She had a renal ultrasound and there was slight hyperechogenicity of this right renal mass so this raised a question of an angiomyolipoma. Subsequently she had an MRI of the abdomen that showed the mass in the right kidney was very suspicious for renal cell carcinoma. The lesion in the left kidney looked like a complicated cyst. The family was counselled about therapy and the question of a partial nephrectomy was raised but I showed the films to my partner and there was a question of a good surgical margin plus risk of interruption of the collecting system or vascular tree centrally. She also ended up requiring a colectomy and a cholecystectomy and was brought to the OR today for that reason.  DESCRIPTION OF PROCEDURE:  The patient was brought to the operating room by Dr. Jamey Ripa and he performed a partial colectmy and a cholecystectomy and then I scrubbed in the case and isolated the  right kidney and reflected the colon medially and dissected up the medial aspect of the kidney lateral to the vena cava and a couple of branches of the ovarian vein caused some bleeding that required control with metal hemoclips. The soft tissue was dissected up to the renal vascula pedicle and the vein dissected out and the artery easily palpated. Black silk was placed around the right renal vein and with that in place, the right renal artery is dissected out and divided and ligated proximally with 2-0 black silk ties and a metal hemoclips. The vein was then totally collapsed and divided with two black silks medially and a metal hemoclip. The upper pole of the kidney was then dissected out leaving the adrenal in place and getting hemostasis with metal hemoclips. The specimen is removed intact and pathologist opened the specimen and and said it was definitely a tumor. The renal fossa was dry with good hemostasis at this point. It was elected not to explore the left kidney as I did not want to do an unnecessary partial nephrectomy at this time and felt that the risk of this being a complicated cyst was quite good. There was a very small lesion and will be followed expectantly. I felt that I did not want to attempt a biopsy and just looking at the lesion I did not think would be that definitive. Therefore the patients wound was closed, dressings applied, dictated separately by Dr. Jamey Ripa and she was taken to  the recovery room in good condition. Dictated by:   Maretta Bees. Vonita Moss, M.D. Attending Physician:  Charlton Haws DD:  01/02/02 TD:  01/04/02 Job: [id number]

## 2011-05-04 NOTE — Discharge Summary (Signed)
[facility]  Patient:    Rhonda Burke, Rhonda Burke [facility] Visit Number: [id number] MRN: [id number]          Service Type: SUR Location: [id number] Attending Physician:  Charlton Haws Dictated by:   Currie Paris, M.D. Admit Date:  01/02/2002 Discharge Date: 01/09/2002   CC:         [name], M.D.  Griffith Citron, M.D.  Maretta Bees. Vonita Moss, M.D.   Discharge Summary  VISIT NUMBER:  [id number]  OFFICE MEDICAL RECORD NUMBER:  [id number]  FINAL DIAGNOSES: 1. Segmental ulcer of sigmoid colon with obstruction. 2. Chronic calculous cholecystitis. 3. Renal angiomyolipoma.  CLINICAL HISTORY:  Rhonda Burke history is noted in the admission note but basically presented with a GI disorder that was initially thought to be a diverticulitis.  As the process of working of that up, she had a CT scan which showed to be what appeared to be tumor of the right kidney and could not be further characterized by MRI but was suspicious for carcinoma.  Colonoscopy showed what appeared to be an obstructing lesion of the sigmoid which biopsy showed only inflammatory tissue.  As part of her workup, she was also found to have gallstones.  After discussion with the patient and her family, she was admitted for sigmoid colectomy, right nephrectomy, and cholecystectomy.  HOSPITAL COURSE:  The patient was admitted and taken to the operating room where she was found to have a stricture of her sigmoid colon which looked inflammatory by the pathologist.  Cholecystectomy was also done as well as an incidental appendectomy.  Dr. Vonita Moss performed a right radical nephrectomy.  Patient tolerated these procedures nicely and had a very stable postoperative course.  She had a soft abdomen and we were able to begin clamping her NG tube on the third postoperative day.  She was switched from epidural to PCA but had minimal pain and was using minimal medications.  A Foley  catheter was removed on January 21 as was her NG and she was begun on sips of liquids and gradually advanced over the next two days to a solid diet.  By January 24, she was passing gas, having bowel movements, minimal pain, tolerating a diet.  She had no fever.  Her abdomen was soft and benign.  Pathology showed an ulcer of the sigmoid colon which extended through the full thickness of the bowel wall into adipose tissue with an associated fissure, and this was suggestive of some localized Crohns disease but no granulomata were identified.  There was no evidence of malignancy.  The gallbladder showed chronic cholecystitis with cholelithiasis.  The appendix was normal.  The kidney showed a renal angiomyolipoma.  Patient was discharged on January 24 in satisfactory condition, Vicodin for pain, and to resume her usual home medications, to follow up in my office in approximately two weeks as well as with Dr. Vonita Moss in a few weeks. Dictated by:   Currie Paris, M.D. Attending Physician:  Charlton Haws DD:  01/09/02 TD:  01/10/02 Job: [id number]

## 2011-07-17 ENCOUNTER — Other Ambulatory Visit: Payer: Self-pay | Admitting: Obstetrics and Gynecology

## 2011-07-17 DIAGNOSIS — Z1231 Encounter for screening mammogram for malignant neoplasm of breast: Secondary | ICD-10-CM

## 2011-08-16 ENCOUNTER — Ambulatory Visit (HOSPITAL_COMMUNITY)
Admission: RE | Admit: 2011-08-16 | Discharge: 2011-08-16 | Disposition: A | Discharge status: Home / Self Care | Payer: Medicare Other | Source: Ambulatory Visit | Attending: Obstetrics and Gynecology | Admitting: Obstetrics and Gynecology

## 2011-08-16 DIAGNOSIS — Z1231 Encounter for screening mammogram for malignant neoplasm of breast: Secondary | ICD-10-CM | POA: Insufficient documentation

## 2011-09-06 ENCOUNTER — Other Ambulatory Visit: Payer: Self-pay | Admitting: Family Medicine

## 2011-09-06 DIAGNOSIS — N6459 Other signs and symptoms in breast: Secondary | ICD-10-CM

## 2011-09-11 ENCOUNTER — Ambulatory Visit
Admission: RE | Admit: 2011-09-11 | Discharge: 2011-09-11 | Disposition: A | Discharge status: Home / Self Care | Payer: Medicare Other | Source: Ambulatory Visit | Attending: Family Medicine | Admitting: Family Medicine

## 2011-09-11 DIAGNOSIS — N6459 Other signs and symptoms in breast: Secondary | ICD-10-CM

## 2011-11-30 ENCOUNTER — Encounter (INDEPENDENT_AMBULATORY_CARE_PROVIDER_SITE_OTHER): Payer: Self-pay | Admitting: Surgery

## 2011-12-04 ENCOUNTER — Ambulatory Visit (INDEPENDENT_AMBULATORY_CARE_PROVIDER_SITE_OTHER): Payer: Medicare Other | Admitting: Surgery

## 2011-12-04 ENCOUNTER — Encounter (INDEPENDENT_AMBULATORY_CARE_PROVIDER_SITE_OTHER): Payer: Self-pay | Admitting: Surgery

## 2011-12-04 VITALS — BP 118/82 | HR 60 | Temp 96.9°F | Resp 12 | Ht 64.0 in | Wt 176.0 lb

## 2011-12-04 DIAGNOSIS — N6019 Diffuse cystic mastopathy of unspecified breast: Secondary | ICD-10-CM

## 2011-12-04 NOTE — Patient Instructions (Signed)
Let me re-examine you in about three months

## 2011-12-04 NOTE — Progress Notes (Signed)
  CC: Possible right breast mass HPI: Her primary patient noticed a spot in the right breast 12:00 position about 2 cm above the areola that he had not previously noticed. It did not go away on a subsequent exam a few months later. A mammogram and ultrasound were negative. Surgical evaluation was requested. The patient having no current breast problems or symptoms. She has had a couple of biopsies in the past for benign processes of the breast.   ROS: This is basically negative  MEDS: Current Outpatient Prescriptions  Medication Sig Dispense Refill  . aspirin 81 MG tablet Take 81 mg by mouth daily.        . carvedilol (COREG) 6.25 MG tablet Take 6.25 mg by mouth 2 (two) times daily with a meal.        . lisinopril (PRINIVIL,ZESTRIL) 20 MG tablet Take 20 mg by mouth daily.        . simvastatin (ZOCOR) 40 MG tablet Take 40 mg by mouth at bedtime.           ALLERGIES: No Known Allergies     PE Gen.: The patient is alert oriented and healthy-appearing. Breasts: The breasts are symmetric in appearance. They are dense consistent with some fibrosis. There is a more prominent area as described by the primary physician in the right breast. It is about 1 x 2 cm somewhat oblong vertically oriented not well circumscribed but does seem to have something that seems to have something of an inch on the medial aspect. It was not hard. Today, is consistent with fibrocystic change and fibrosis.  Data Reviewed I have reviewed the notes from Dr.Blomgren as well as mammogram ultrasound reports  Assessment Fibrocystic changes  Plan I told her I would like to reevaluate her in three months to be sure this area is stable. At this point I did not think a biopsy was  needed but does need to be followed.

## 2012-02-14 ENCOUNTER — Encounter: Payer: Self-pay | Admitting: Gynecology

## 2012-02-14 DIAGNOSIS — K56609 Unspecified intestinal obstruction, unspecified as to partial versus complete obstruction: Secondary | ICD-10-CM | POA: Insufficient documentation

## 2012-02-14 DIAGNOSIS — D179 Benign lipomatous neoplasm, unspecified: Secondary | ICD-10-CM | POA: Insufficient documentation

## 2012-02-22 ENCOUNTER — Encounter: Payer: Self-pay | Admitting: Obstetrics and Gynecology

## 2012-02-22 ENCOUNTER — Ambulatory Visit (INDEPENDENT_AMBULATORY_CARE_PROVIDER_SITE_OTHER): Payer: Medicare Other | Admitting: Obstetrics and Gynecology

## 2012-02-22 VITALS — BP 158/78 | Ht 64.5 in | Wt 181.0 lb

## 2012-02-22 DIAGNOSIS — R351 Nocturia: Secondary | ICD-10-CM

## 2012-02-22 DIAGNOSIS — N898 Other specified noninflammatory disorders of vagina: Secondary | ICD-10-CM

## 2012-02-22 DIAGNOSIS — N6019 Diffuse cystic mastopathy of unspecified breast: Secondary | ICD-10-CM

## 2012-02-22 DIAGNOSIS — N7013 Chronic salpingitis and oophoritis: Secondary | ICD-10-CM

## 2012-02-22 DIAGNOSIS — N7011 Chronic salpingitis: Secondary | ICD-10-CM

## 2012-02-22 NOTE — Progress Notes (Signed)
Patient came back to see me today for followup. There was a questionable lump in her right breast found by her PCP and she went for short-term mammogram and ultrasound with all  normal. She also saw her doctor streck. He felt she does have fibrocystic breasts without dominant lesion. She is due for followup mammogram this year. She has had 2 normal bone densities. She does her lab through her PCP. She does get intermittent nocturia. It does not bother her enough to take medication. She has no urgency of urination. She has no dysuria or urgency incontinence. We have watched her for a while with what we felt were bilateral hydrosalpinx. She has remained asymptomatic without bleeding vaginally or pain. It is time for followup ultrasound but the patient is reluctant to do it since she is asymptomatic and has been watched since 2005. She is having no menopausal symptoms. She has had no fractures. She is a small vaginal cyst that we've also watched the is also asymptomatic.  ROS: 12 system review done. Pertinent positives above. Patient also has coronary artery disease with an occasional arrhythmia. She also was watched by the urologist for renal cysts and one kidney.  Physical examination:  Beola Cord present. HEENT within normal limits. Neck: Thyroid not large. No masses. Supraclavicular nodes: not enlarged. Breasts: Examined in both sitting midline position. No skin changes and no masses. Abdomen: Soft no guarding rebound or masses or hernia. Pelvic: External: Within normal limits. BUS: Within normal limits. Vaginal:within normal limits except for 1 cm submucosal nodule on the left posterior wall. Good estrogen effect. No evidence of cystocele rectocele or enterocele. Cervix: clean. Uterus: Normal size and shape. Adnexa: No masses. Rectovaginal exam: Confirmatory and negative. Extremities: Within normal limits.  Assessment: #1. Bilateral adnexal masses #2. Vaginal cyst #3. Fibrocystic breast disease  #4. Nocturia.  Plan: Advised followup ultrasound. Continue yearly mammograms.

## 2012-02-23 LAB — URINALYSIS W MICROSCOPIC + REFLEX CULTURE
Bacteria, UA: NONE SEEN
Crystals: NONE SEEN
Nitrite: NEGATIVE
Protein, ur: NEGATIVE mg/dL
Specific Gravity, Urine: 1.02 (ref 1.005–1.030)
Squamous Epithelial / LPF: NONE SEEN
Urobilinogen, UA: 0.2 mg/dL (ref 0.0–1.0)

## 2012-03-07 ENCOUNTER — Encounter (INDEPENDENT_AMBULATORY_CARE_PROVIDER_SITE_OTHER): Payer: Self-pay | Admitting: Surgery

## 2012-03-07 ENCOUNTER — Ambulatory Visit (INDEPENDENT_AMBULATORY_CARE_PROVIDER_SITE_OTHER): Payer: Medicare Other | Admitting: Surgery

## 2012-03-07 VITALS — BP 122/76 | HR 60 | Temp 98.1°F | Resp 16 | Ht 64.0 in | Wt 180.8 lb

## 2012-03-07 DIAGNOSIS — N6019 Diffuse cystic mastopathy of unspecified breast: Secondary | ICD-10-CM

## 2012-03-07 NOTE — Progress Notes (Signed)
Chief complaint: Followup fibrocystic nodule  History of present illness: I saw this patient and several months ago and there was a nodular density in the right breast 12:00 position it was not apparent on mammogram or MRI. Clinically I thought it was benign but had her come back for followup now.  The patient has noticed no change in the area. She's had no other breast symptoms.  Exam: Gen.: The patient alert oriented and in no discomfort. Breasts: The breasts are symmetric in appearance. There is no dominant mass. There is slight firmness to the right breast 12:00 position as previously described. This is about a 1 x 2 cm area vertically oriented somewhat oblong. It is nontender. It is soft. It is slightly more apparent than the surrounding breast tissue.  Data review: No new information  Impression: Fibrocystic nodularity although this could supposedly a small lipoma. However it is clinically benign.  Plan: She will return if she notices any change in this area. I told her that she needs to continue to have mammographic followup.

## 2012-03-07 NOTE — Patient Instructions (Signed)
If you have any questions or change in the breast come back to see me. Be sure to have annual mammograms

## 2012-07-29 ENCOUNTER — Other Ambulatory Visit: Payer: Self-pay | Admitting: Family Medicine

## 2012-07-29 DIAGNOSIS — Z1231 Encounter for screening mammogram for malignant neoplasm of breast: Secondary | ICD-10-CM

## 2012-09-12 ENCOUNTER — Ambulatory Visit (HOSPITAL_COMMUNITY)
Admission: RE | Admit: 2012-09-12 | Discharge: 2012-09-12 | Disposition: A | Discharge status: Home / Self Care | Payer: Medicare Other | Source: Ambulatory Visit | Attending: Family Medicine | Admitting: Family Medicine

## 2012-09-12 DIAGNOSIS — Z1231 Encounter for screening mammogram for malignant neoplasm of breast: Secondary | ICD-10-CM | POA: Insufficient documentation

## 2013-08-25 ENCOUNTER — Other Ambulatory Visit (HOSPITAL_COMMUNITY): Payer: Self-pay | Admitting: Family Medicine

## 2013-08-25 DIAGNOSIS — Z1231 Encounter for screening mammogram for malignant neoplasm of breast: Secondary | ICD-10-CM

## 2013-09-14 ENCOUNTER — Ambulatory Visit (HOSPITAL_COMMUNITY)
Admission: RE | Admit: 2013-09-14 | Discharge: 2013-09-14 | Disposition: A | Discharge status: Home / Self Care | Payer: Medicare Other | Source: Ambulatory Visit | Attending: Family Medicine | Admitting: Family Medicine

## 2013-09-14 DIAGNOSIS — Z1231 Encounter for screening mammogram for malignant neoplasm of breast: Secondary | ICD-10-CM | POA: Insufficient documentation

## 2014-02-11 ENCOUNTER — Encounter: Payer: Self-pay | Admitting: *Deleted

## 2014-02-15 ENCOUNTER — Ambulatory Visit (INDEPENDENT_AMBULATORY_CARE_PROVIDER_SITE_OTHER): Payer: Medicare Other | Admitting: Cardiovascular Disease

## 2014-02-15 ENCOUNTER — Encounter: Payer: Self-pay | Admitting: Cardiovascular Disease

## 2014-02-15 VITALS — BP 132/80 | HR 69 | Ht 64.0 in | Wt 186.4 lb

## 2014-02-15 DIAGNOSIS — E559 Vitamin D deficiency, unspecified: Secondary | ICD-10-CM

## 2014-02-15 DIAGNOSIS — E785 Hyperlipidemia, unspecified: Secondary | ICD-10-CM

## 2014-02-15 DIAGNOSIS — I447 Left bundle-branch block, unspecified: Secondary | ICD-10-CM

## 2014-02-15 DIAGNOSIS — I10 Essential (primary) hypertension: Secondary | ICD-10-CM

## 2014-02-15 NOTE — Patient Instructions (Signed)
Your physician recommends that you schedule a follow-up appointment in: 1 year. No changes were made today in your therapy. 

## 2014-02-16 ENCOUNTER — Encounter: Payer: Self-pay | Admitting: Cardiovascular Disease

## 2014-02-16 DIAGNOSIS — E785 Hyperlipidemia, unspecified: Secondary | ICD-10-CM | POA: Insufficient documentation

## 2014-02-16 DIAGNOSIS — E559 Vitamin D deficiency, unspecified: Secondary | ICD-10-CM | POA: Insufficient documentation

## 2014-02-16 DIAGNOSIS — I447 Left bundle-branch block, unspecified: Secondary | ICD-10-CM | POA: Insufficient documentation

## 2014-02-16 DIAGNOSIS — I10 Essential (primary) hypertension: Secondary | ICD-10-CM | POA: Insufficient documentation

## 2014-02-16 NOTE — Progress Notes (Signed)
Patient ID: Sutton Plake, female   DOB: 06-Sep-1927, 80 y.o.   MRN: [id number]     HPI: Ilma Achee is a 80 y.o. female who presents to the office for one year cardiology evaluation.  Ms Dimauro has a history of left bundle branch block which was discovered when she presented for a routine colonoscopy and had significant ectopy an ECG in 2008. At that time, I saw her for cardiology evaluation and echo Doppler study showed an ejection fraction of 35% with mild pulmonary hypertension. A nuclear perfusion study raised the possibility of mild ischemia and consequently she underwent a right and left heart catheterization which showed an ejection fraction of 35-40% and mild pulmonary hypertension with a PA pressure 35/15. Her LV pressure was 135/13. She was treated with medical therapy. An echo Doppler study in February 2012 showed an ejection fraction of 40-45% and she had septal wall motion abnormality due to her conduction abnormality. There was still evidence of pulmonary hypertension with estimated RV systolic pressure 41 mm there was evidence for aortic valve sclerosis.  Additional problems include hyperlipidemia and hypertension. She has tolerated a left knee replacement surgery by Dr. Sandie Ano 2012 about cardiovascular problems. Her last stress test was done preoperatively in February 2012 which showed normal perfusion.  Over the past year, she is continued to be stable. She specifically denies chest pain or shortness of breath or episodes of tachycardia, presyncope or syncope. She presents for evaluation.  Past Medical History  Diagnosis Date  . Asthma, mild intermittent   . Bilateral hearing loss   . Degenerative disc disease     C-spine  . Pulmonary hypertension   . Venous insufficiency   . Aortic sclerosis   . Mitral regurgitation   . Hypercholesteremia   . Osteopenia   . Breast thickening     right breast  . Angiomyolipoma     right  . Bowel obstruction     sigmoid  ulceration and obstruction  . Hypertension   . Fibrocystic breast disease   . LBBB (left bundle branch block)     Past Surgical History  Procedure Laterality Date  . Breast excisional biopsy  1999  . Colectomy  12/2001    left sigmoid, obstruction/diverticulitis  . Nephrectomy  12/2001    right, angiomyolipoma  . Appendectomy  12/2001  . Cholecystectomy  2003  . Cataract extraction Bilateral 2004  . Total knee arthroplasty Left 03/16/2011    Dr Maureen Ralphs  . Hernia repair    . Colon surgery      bowel obstruction and ulceration  . Transthoracic echocardiogram  01/2011    EF 40-45%, mild conc LVH; RV mildly dilated; mild mitral annular calcif, mild MR; mild TR, RSVP elevated at 40-34mHg (mild pulm htn); AV mildly sclerotic   . Nm myocar perf wall motion  01/2011    dipyridamole myoview; normal pattern of perfusion in all regions, EF 61%, normal, low risk scan   . Cardiac catheterization  10/02/2007    moderate LV dysfunction, EF 35-40%, conc LVH, normal coronaries, mild pulm HTN (Dr. TCorky Downs    No Known Allergies  Current Outpatient Prescriptions  Medication Sig Dispense Refill  . aspirin 81 MG tablet Take 81 mg by mouth daily.        . Calcium Carbonate-Vitamin D (CALCIUM + D PO) Take by mouth.      . carvedilol (COREG) 6.25 MG tablet Take 6.25 mg by mouth 2 (two) times daily with a meal.        .  cholecalciferol (VITAMIN D) 1000 UNITS tablet Take 1,000 Units by mouth daily.      Marland Kitchen lisinopril (PRINIVIL,ZESTRIL) 20 MG tablet Take 20 mg by mouth daily.        . simvastatin (ZOCOR) 40 MG tablet Take 40 mg by mouth at bedtime.         No current facility-administered medications for this visit.    History   Social History  . Marital Status: Married    Spouse Name: N/A    Number of Children: 3  . Years of Education: N/A   Occupational History  .     Social History Main Topics  . Smoking status: Former Research scientist (life sciences)  . Smokeless tobacco: Never Used  . Alcohol Use: 0.6 oz/week     1 Glasses of wine per week     Comment: one glass daily  . Drug Use: No  . Sexual Activity: Yes    Birth Control/ Protection: Post-menopausal   Other Topics Concern  . Not on file   Social History Narrative  . No narrative on file   Socially she is very has 3 children, 5 grandchildren one great-grandchild. There is no tobacco or alcohol use. She does not routinely exercise.  Family History  Problem Relation Age of Onset  . Heart disease Mother     also stroke  . Heart disease Father     also stroke   . Lung cancer Sister   . Cancer Sister 27    ROS is negative for fevers, chills or night sweats. She denies change in vision or hearing. She is unaware lymphadenopathy. She denies cough or increased sputum production. There is no wheezing. She denies chest pressure. She denies presyncope or syncope. She denies abdominal pain nausea vomiting or diarrhea. There is no blood in stool or urine. She denies dysuria. She denies myalgias. There is no claudication. She denies paresthesias. There is no history of diabetes. She denies cold or heat intolerance. She denies difficulty with sleep.  Other comprehensive 14 point system review is negative.  PE BP 132/80  Pulse 69  Ht _0  (1.626 m)  Wt 186 lb 6.4 oz (84.55 kg)  BMI 31.98 kg/m2  General: Alert, oriented, no distress.  Skin: normal turgor, no rashes HEENT: Normocephalic, atraumatic. Pupils round and reactive; sclera anicteric;no lid lag. Extraocular muscles intact;; no xanthelasmas. Nose without nasal septal hypertrophy Mouth/Parynx benign; Mallinpatti scale 2 Neck: No JVD, no carotid bruits; normal carotid upstroke Lungs: clear to ausculatation and percussion; no wheezing or rales Chest wall: no tenderness to palpitation Heart: RRR, s1 s2 normal; 1/6 systolic murmur in the aortic region;no diastolic murmur, rub thrills or heaves Abdomen: soft, nontender; no hepatosplenomehaly, BS+; abdominal aorta nontender and not dilated by  palpation. Back: no CVA tenderness Pulses 2+ Extremities: no clubbing cyanosis or edema, Homan's sign negative  Neurologic: grossly nonfocal; cranial nerves grossly normal. Psychologic: normal affect and mood.  ECG (independently read by me): Normal sinus rhythm with left bundle-branch block with a ventricular rate 69 beats per minute.  LABS:  BMET    Component Value Date/Time   NA 138 03/18/2011 0540   K 4.1 03/18/2011 0540   CL 106 03/18/2011 0540   CO2 29 03/18/2011 0540   GLUCOSE 130* 03/18/2011 0540   BUN 9 03/18/2011 0540   CREATININE 0.72 03/18/2011 0540   CALCIUM 8.7 03/18/2011 0540   GFRNONAA >60 03/18/2011 0540   GFRAA  Value: >60        The eGFR has  been calculated using the MDRD equation. This calculation has not been validated in all clinical situations. eGFR's persistently <60 mL/min signify possible Chronic Kidney Disease. 03/18/2011 0540     Hepatic Function Panel     Component Value Date/Time   PROT 7.5 03/08/2011 1125   ALBUMIN 4.0 03/08/2011 1125   AST 33 03/08/2011 1125   ALT 24 03/08/2011 1125   ALKPHOS 50 03/08/2011 1125   BILITOT 0.8 03/08/2011 1125     CBC    Component Value Date/Time   WBC 11.1* 03/19/2011 0453   RBC 3.11* 03/19/2011 0453   HGB 9.6* 03/19/2011 0453   HCT 29.6* 03/19/2011 0453   PLT 259 03/19/2011 0453   MCV 95.2 03/19/2011 0453   MCH 30.9 03/19/2011 0453   MCHC 32.4 03/19/2011 0453   RDW 13.1 03/19/2011 0453     BNP No results found for this basename: probnp    Lipid Panel  No results found for this basename: chol, trig, hdl, cholhdl, vldl, ldlcalc     RADIOLOGY: No results found.    ASSESSMENT AND PLAN: Ms. Jouri Threat is now 80 years old. She has history of chronic left bundle branch block as well as hypertension. Her blood pressure today is well controlled at 120/78 repeated by me her current regimen of carvedilol 6.25 mg twice a day and lisinopril 20 mg daily. She also is on a baby aspirin 81 mg and is tolerating this well. She is tolerating  simvastatin 40 mg for hyperlipidemia. Dr. Sandi Mariscal has recently checked her laboratory and I have reviewed these.  She was mildly vitamin D insufficient with a level of 25.8 and I suggested vitamin D over-the-counter supplementation. Her total cholesterol was 187 triglycerides 125, non-HDL 120 and LDL cholesterol at 95. LPa was normal at 9.0. She continues to be asymptomatic on her current medical regimen. I will see her one year for followup evaluation     Troy Sine, MD, [facility]  02/16/2014 5:47 PM

## 2014-08-16 ENCOUNTER — Other Ambulatory Visit (HOSPITAL_COMMUNITY): Payer: Self-pay | Admitting: Family Medicine

## 2014-08-16 DIAGNOSIS — Z1231 Encounter for screening mammogram for malignant neoplasm of breast: Secondary | ICD-10-CM

## 2014-09-15 ENCOUNTER — Ambulatory Visit (HOSPITAL_COMMUNITY)
Admission: RE | Admit: 2014-09-15 | Discharge: 2014-09-15 | Disposition: A | Discharge status: Home / Self Care | Payer: Medicare Other | Source: Ambulatory Visit | Attending: Family Medicine | Admitting: Family Medicine

## 2014-09-15 DIAGNOSIS — Z1231 Encounter for screening mammogram for malignant neoplasm of breast: Secondary | ICD-10-CM | POA: Diagnosis present

## 2014-09-16 ENCOUNTER — Other Ambulatory Visit: Payer: Self-pay | Admitting: Family Medicine

## 2014-09-16 DIAGNOSIS — R928 Other abnormal and inconclusive findings on diagnostic imaging of breast: Secondary | ICD-10-CM

## 2014-09-23 ENCOUNTER — Ambulatory Visit
Admission: RE | Admit: 2014-09-23 | Discharge: 2014-09-23 | Disposition: A | Discharge status: Home / Self Care | Payer: Medicare Other | Source: Ambulatory Visit | Attending: Family Medicine | Admitting: Family Medicine

## 2014-09-23 DIAGNOSIS — R928 Other abnormal and inconclusive findings on diagnostic imaging of breast: Secondary | ICD-10-CM

## 2014-10-15 ENCOUNTER — Other Ambulatory Visit: Payer: Self-pay | Admitting: Family Medicine

## 2014-10-15 DIAGNOSIS — M8580 Other specified disorders of bone density and structure, unspecified site: Secondary | ICD-10-CM

## 2014-10-18 ENCOUNTER — Encounter: Payer: Self-pay | Admitting: Cardiovascular Disease

## 2014-10-27 ENCOUNTER — Ambulatory Visit
Admission: RE | Admit: 2014-10-27 | Discharge: 2014-10-27 | Disposition: A | Discharge status: Home / Self Care | Payer: Medicare Other | Source: Ambulatory Visit | Attending: Family Medicine | Admitting: Family Medicine

## 2014-10-27 DIAGNOSIS — M8580 Other specified disorders of bone density and structure, unspecified site: Secondary | ICD-10-CM

## 2015-02-15 ENCOUNTER — Encounter: Payer: Self-pay | Admitting: Cardiovascular Disease

## 2015-02-15 ENCOUNTER — Ambulatory Visit (INDEPENDENT_AMBULATORY_CARE_PROVIDER_SITE_OTHER): Payer: Medicare Other | Admitting: Cardiovascular Disease

## 2015-02-15 VITALS — BP 148/66 | Ht 64.0 in | Wt 185.5 lb

## 2015-02-15 DIAGNOSIS — E785 Hyperlipidemia, unspecified: Secondary | ICD-10-CM

## 2015-02-15 DIAGNOSIS — D179 Benign lipomatous neoplasm, unspecified: Secondary | ICD-10-CM

## 2015-02-15 DIAGNOSIS — I10 Essential (primary) hypertension: Secondary | ICD-10-CM

## 2015-02-15 DIAGNOSIS — I447 Left bundle-branch block, unspecified: Secondary | ICD-10-CM

## 2015-02-15 DIAGNOSIS — I429 Cardiomyopathy, unspecified: Secondary | ICD-10-CM

## 2015-02-15 NOTE — Progress Notes (Signed)
Patient ID: Rhonda Burke, female   DOB: 01-Aug-1927, 81 y.o.   MRN: [id number]   Primary MD: Dr. Derinda Late  HPI: Rhonda Burke is a 81 y.o. female who presents to the office for one year cardiology evaluation.  Rhonda Burke has a history of left bundle branch block which was discovered when Rhonda Burke presented for a routine colonoscopy and had significant ectopy an ECG in 2008.  I saw her for cardiology evaluation and echo Doppler study showed an ejection fraction of 35% with mild pulmonary hypertension. A nuclear perfusion study raised the possibility of mild ischemia and consequently Rhonda Burke underwent a right and left heart catheterization which showed an ejection fraction of 35-40% and mild pulmonary hypertension with a PA pressure 35/15. Her LV pressure was 135/13. Rhonda Burke was treated with medical therapy. An echo Doppler study in February 2012 showed an ejection fraction of 40-45% and Rhonda Burke had septal wall motion abnormality due to her conduction abnormality. There was still evidence of pulmonary hypertension with estimated RV systolic pressure 41 mm there was evidence for aortic valve sclerosis.  Additional problems include hyperlipidemia and hypertension. Rhonda Burke has tolerated a left knee replacement surgery by Dr. Sandie Ano 2012 about cardiovascular problems. Her last stress test was done preoperatively in February 2012 which showed normal perfusion.  Rhonda Burke is status post right nephrectomy for angiomyolipoma.  Over the past year, Rhonda Burke is continued to be stable. Rhonda Burke specifically denies chest pain or shortness of breath or episodes of tachycardia, presyncope or syncope.  Rhonda Burke sees Dr. Sandi Mariscal, for primary care.  He checks all laboratory.  Rhonda Burke presents for evaluation.  Past Medical History  Diagnosis Date  . Asthma, mild intermittent   . Bilateral hearing loss   . Degenerative disc disease     C-spine  . Pulmonary hypertension   . Venous insufficiency   . Aortic sclerosis   . Mitral regurgitation   .  Hypercholesteremia   . Osteopenia   . Breast thickening     right breast  . Angiomyolipoma     right  . Bowel obstruction     sigmoid ulceration and obstruction  . Hypertension   . Fibrocystic breast disease   . LBBB (left bundle branch block)     Past Surgical History  Procedure Laterality Date  . Breast excisional biopsy  1999  . Colectomy  12/2001    left sigmoid, obstruction/diverticulitis  . Nephrectomy  12/2001    right, angiomyolipoma  . Appendectomy  12/2001  . Cholecystectomy  2003  . Cataract extraction Bilateral 2004  . Total knee arthroplasty Left 03/16/2011    Dr Maureen Ralphs  . Hernia repair    . Colon surgery      bowel obstruction and ulceration  . Transthoracic echocardiogram  01/2011    EF 40-45%, mild conc LVH; RV mildly dilated; mild mitral annular calcif, mild MR; mild TR, RSVP elevated at 40-22mHg (mild pulm htn); AV mildly sclerotic   . Nm myocar perf wall motion  01/2011    dipyridamole myoview; normal pattern of perfusion in all regions, EF 61%, normal, low risk scan   . Cardiac catheterization  10/02/2007    moderate LV dysfunction, EF 35-40%, conc LVH, normal coronaries, mild pulm HTN (Dr. TCorky Downs    No Known Allergies  Current Outpatient Prescriptions  Medication Sig Dispense Refill  . Calcium Carbonate-Vitamin D (CALCIUM + D PO) Take by mouth.    . carvedilol (COREG) 6.25 MG tablet Take 6.25 mg by mouth 2 (two) times daily with a  meal.      . cholecalciferol (VITAMIN D) 1000 UNITS tablet Take 1,000 Units by mouth daily.    Marland Kitchen lisinopril (PRINIVIL,ZESTRIL) 20 MG tablet Take 20 mg by mouth daily.      . simvastatin (ZOCOR) 40 MG tablet Take 40 mg by mouth at bedtime.       No current facility-administered medications for this visit.    History   Social History  . Marital Status: Married    Spouse Name: N/A  . Number of Children: 3  . Years of Education: N/A   Occupational History  .     Social History Main Topics  . Smoking status: Former  Research scientist (life sciences)  . Smokeless tobacco: Never Used  . Alcohol Use: 0.6 oz/week    1 Glasses of wine per week     Comment: one glass daily  . Drug Use: No  . Sexual Activity: Yes    Birth Control/ Protection: Post-menopausal   Other Topics Concern  . Not on file   Social History Narrative   Socially Rhonda Burke is very has 3 children, 5 grandchildren one great-grandchild. There is no tobacco or alcohol use. Rhonda Burke does not routinely exercise.  Family History  Problem Relation Age of Onset  . Heart disease Mother     also stroke  . Heart disease Father     also stroke   . Lung cancer Sister   . Cancer Sister 70   ROS General: Negative; No fevers, chills, or night sweats;  HEENT: Negative; No changes in vision or hearing, sinus congestion, difficulty swallowing Pulmonary: Negative; No cough, wheezing, shortness of breath, hemoptysis Cardiovascular: Negative; No chest pain, presyncope, syncope, palpitations GI: Negative; No nausea, vomiting, diarrhea, or abdominal pain GU: Negative; No dysuria, hematuria, or difficulty voiding Musculoskeletal: Positive for degenerative disc disease; no myalgias, joint pain, or weakness Hematologic/Oncology: Negative; no easy bruising, bleeding Endocrine: Negative; no heat/cold intolerance; no diabetes Neuro: Negative; no changes in balance, headaches Skin: Negative; No rashes or skin lesions Psychiatric: Negative; No behavioral problems, depression Sleep: Negative; No snoring, daytime sleepiness, hypersomnolence, bruxism, restless legs, hypnogognic hallucinations, no cataplexy Other comprehensive 14 point system review is negative.   PE BP 148/66 mmHg  Ht _0  (1.626 m)  Wt 185 lb 8 oz (84.142 kg)  BMI 31.83 kg/m2  Repeat blood pressure by me was 150/70 General: Alert, oriented, no distress.  Skin: normal turgor, no rashes HEENT: Normocephalic, atraumatic. Pupils round and reactive; sclera anicteric;no lid lag. Extraocular muscles intact;; no  xanthelasmas. Nose without nasal septal hypertrophy Mouth/Parynx benign; Mallinpatti scale 2 Neck: No JVD, no carotid bruits; normal carotid upstroke Lungs: clear to ausculatation and percussion; no wheezing or rales Chest wall: no tenderness to palpitation Heart: RRR, s1 s2 normal; 1/6 systolic murmur in the aortic region;no diastolic murmur, rub thrills or heaves Abdomen: soft, nontender; no hepatosplenomehaly, BS+; abdominal aorta nontender and not dilated by palpation. Back: no CVA tenderness Pulses 2+ Extremities: no clubbing cyanosis or edema, Homan's sign negative  Neurologic: grossly nonfocal; cranial nerves grossly normal. Psychologic: normal affect and mood.  ECG (independently read by me): Normal sinus rhythm at 67.  Left bundle branch block with repolarization changes.  Premature atrial complex.  March 2015 ECG (independently read by me): Normal sinus rhythm with left bundle-branch block with a ventricular rate 69 beats per minute.  LABS:  BMET    Component Value Date/Time   NA 138 03/18/2011 0540   K 4.1 03/18/2011 0540   CL 106 03/18/2011 0540  CO2 29 03/18/2011 0540   GLUCOSE 130* 03/18/2011 0540   BUN 9 03/18/2011 0540   CREATININE 0.72 03/18/2011 0540   CALCIUM 8.7 03/18/2011 0540   GFRNONAA >60 03/18/2011 0540   GFRAA  03/18/2011 0540    >60        The eGFR has been calculated using the MDRD equation. This calculation has not been validated in all clinical situations. eGFR's persistently <60 mL/min signify possible Chronic Kidney Disease.     Hepatic Function Panel     Component Value Date/Time   PROT 7.5 03/08/2011 1125   ALBUMIN 4.0 03/08/2011 1125   AST 33 03/08/2011 1125   ALT 24 03/08/2011 1125   ALKPHOS 50 03/08/2011 1125   BILITOT 0.8 03/08/2011 1125     CBC    Component Value Date/Time   WBC 11.1* 03/19/2011 0453   RBC 3.11* 03/19/2011 0453   HGB 9.6* 03/19/2011 0453   HCT 29.6* 03/19/2011 0453   PLT 259 03/19/2011 0453   MCV  95.2 03/19/2011 0453   MCH 30.9 03/19/2011 0453   MCHC 32.4 03/19/2011 0453   RDW 13.1 03/19/2011 0453     BNP No results found for: PROBNP  Lipid Panel  No results found for: CHOL   RADIOLOGY: No results found.    ASSESSMENT AND PLAN: Rhonda Burke is an 81 years old female who has chronic left bundle branch block as well as hypertension.  Cardiac catheterization in 2008 showed normal coronary arteries.  There was mild pulmonary hypertension.  At that time, ejection fraction was 35-40%.  Her ejection fraction subsequently improved on a follow-up echo in 2012 at 40-45%.  Her blood pressure today is mildly elevated and on repeat by me was 150/70.  Rhonda Burke has been taking lisinopril 20 mg daily in addition to carvedilol 6.25 mg twice a day.  Her resting pulse is in the 60s.  I do not know her renal function.  I am hesitant to further titrate her lisinopril since Rhonda Burke has one kidney remaining and kidney function by me is unknown.  However, Rhonda Burke is been told that her labs were good in the past.  I recommended Rhonda Burke continue to monitor her blood pressure with aim to keep her systolic blood pressure ideally less than 140.  I will try to obtain the results of laboratory done by Dr. Sandi Mariscal.  It is been over 4 years since her last echo Doppler study.  Rather than wait a year to see her, I will see her in 6 months for reevaluation prior to that office visit.  Rhonda Burke will obtain a echo Doppler assessment.  Rhonda Burke continues to be on simvastatin 40 mg for hyperlipidemia.  Rhonda Burke denies myalgias.  I will see her in 6 months for reevaluation.  Time spent: 25 minutes  Troy Sine, MD, [facility]  02/15/2015 3:16 PM

## 2015-02-15 NOTE — Patient Instructions (Signed)
Your physician has requested that you have an echocardiogram. Echocardiography is a painless test that uses sound waves to create images of your heart. It provides your doctor with information about the size and shape of your heart and how well your heart's chambers and valves are working. This procedure takes approximately one hour. There are no restrictions for this procedure.   Your physician wants you to follow-up in: 6 months or sooner if needed with Dr. Claiborne Billings. You will receive a reminder letter in the mail two months in advance. If you don't receive a letter, please call our office to schedule the follow-up appointment.

## 2015-02-23 ENCOUNTER — Ambulatory Visit (HOSPITAL_COMMUNITY)
Admission: RE | Admit: 2015-02-23 | Discharge: 2015-02-23 | Disposition: A | Discharge status: Home / Self Care | Payer: Medicare Other | Source: Ambulatory Visit | Attending: Internal Medicine | Admitting: Internal Medicine

## 2015-02-23 DIAGNOSIS — I429 Cardiomyopathy, unspecified: Secondary | ICD-10-CM

## 2015-02-23 DIAGNOSIS — I428 Other cardiomyopathies: Secondary | ICD-10-CM | POA: Diagnosis not present

## 2015-02-23 NOTE — Progress Notes (Signed)
2D Echo Performed 02/23/2015    Younes Degeorge, RCS  

## 2015-02-25 ENCOUNTER — Encounter: Payer: Self-pay | Admitting: *Deleted

## 2015-04-25 ENCOUNTER — Ambulatory Visit
Admission: RE | Admit: 2015-04-25 | Discharge: 2015-04-25 | Disposition: A | Discharge status: Home / Self Care | Payer: Medicare Other | Source: Ambulatory Visit | Attending: Family Medicine | Admitting: Family Medicine

## 2015-04-25 ENCOUNTER — Other Ambulatory Visit: Payer: Self-pay | Admitting: Family Medicine

## 2015-04-25 DIAGNOSIS — M25561 Pain in right knee: Secondary | ICD-10-CM

## 2015-06-07 ENCOUNTER — Telehealth: Payer: Self-pay | Admitting: Cardiovascular Disease

## 2015-06-10 NOTE — Telephone Encounter (Signed)
Closed encounter °

## 2015-09-02 ENCOUNTER — Other Ambulatory Visit: Payer: Self-pay | Admitting: Family Medicine

## 2015-09-02 ENCOUNTER — Ambulatory Visit
Admission: RE | Admit: 2015-09-02 | Discharge: 2015-09-02 | Disposition: A | Discharge status: Home / Self Care | Payer: Medicare Other | Source: Ambulatory Visit | Attending: Family Medicine | Admitting: Family Medicine

## 2015-09-02 DIAGNOSIS — R05 Cough: Secondary | ICD-10-CM

## 2015-09-02 DIAGNOSIS — R0989 Other specified symptoms and signs involving the circulatory and respiratory systems: Secondary | ICD-10-CM

## 2015-09-02 DIAGNOSIS — R062 Wheezing: Secondary | ICD-10-CM

## 2015-09-02 DIAGNOSIS — R059 Cough, unspecified: Secondary | ICD-10-CM

## 2015-09-12 ENCOUNTER — Other Ambulatory Visit: Payer: Self-pay

## 2015-09-12 ENCOUNTER — Ambulatory Visit (INDEPENDENT_AMBULATORY_CARE_PROVIDER_SITE_OTHER): Payer: Medicare Other | Admitting: Cardiovascular Disease

## 2015-09-12 VITALS — BP 132/76 | HR 60 | Ht 64.0 in | Wt 181.2 lb

## 2015-09-12 DIAGNOSIS — E785 Hyperlipidemia, unspecified: Secondary | ICD-10-CM

## 2015-09-12 DIAGNOSIS — I10 Essential (primary) hypertension: Secondary | ICD-10-CM | POA: Diagnosis not present

## 2015-09-12 DIAGNOSIS — I447 Left bundle-branch block, unspecified: Secondary | ICD-10-CM | POA: Diagnosis not present

## 2015-09-12 DIAGNOSIS — Z1231 Encounter for screening mammogram for malignant neoplasm of breast: Secondary | ICD-10-CM

## 2015-09-12 NOTE — Patient Instructions (Signed)
Your physician wants you to follow-up in: 1 Year or sooner if needed. You will receive a reminder letter in the mail two months in advance. If you don't receive a letter, please call our office to schedule the follow-up appointment.

## 2015-09-14 ENCOUNTER — Encounter: Payer: Self-pay | Admitting: Cardiovascular Disease

## 2015-09-14 NOTE — Progress Notes (Signed)
Patient ID: Rhonda Burke, female   DOB: 1927/08/10, 81 y.o.   MRN: [id number]   Primary MD: Dr. Derinda Late  HPI: Rhonda Burke is a 81 y.o. female who presents to the office for a 6 month cardiology evaluation.  Ms Arkin has a history of left bundle branch block which was discovered when she presented for a routine colonoscopy and had significant ectopy an ECG in 2008.  I saw her for cardiology evaluation and echo Doppler study showed an ejection fraction of 35% with mild pulmonary hypertension. A nuclear perfusion study raised the possibility of mild ischemia and consequently she underwent a right and left heart catheterization which showed an ejection fraction of 35-40% and mild pulmonary hypertension with a PA pressure 35/15. Her LV pressure was 135/13. She was treated with medical therapy. An echo Doppler study in February 2012 showed an ejection fraction of 40-45% and she had septal wall motion abnormality due to her conduction abnormality. There was still evidence of pulmonary hypertension with estimated RV systolic pressure 41 mm there was evidence for aortic valve sclerosis.  Additional problems include hyperlipidemia and hypertension. She has tolerated a left knee replacement surgery by Dr. Sandie Ano 2012 about cardiovascular problems. Her last stress test was done preoperatively in February 2012 which showed normal perfusion.  She is status post right nephrectomy for angiomyolipoma.  On 02/23/2015 she underwent a follow-up echo Doppler study.  This showed an ejection fraction of 40-45%.  There was grade 1 diastolic dysfunction.  There was trivial aortic insufficiency.  There was mild pulmonary hypertension with a PA pressure 35 mm.  Since I last saw her, she tells me she suffered pneumonia and recently completed a course of anabiotic therapy.  She denies any exertionally precipitated chest tightness.  He syncope or syncope.  She is unaware of any palpitations.  She denies any blood  in her stool or urine.  She presents for evaluation.  Past Medical History  Diagnosis Date  . Asthma, mild intermittent   . Bilateral hearing loss   . Degenerative disc disease     C-spine  . Pulmonary hypertension   . Venous insufficiency   . Aortic sclerosis   . Mitral regurgitation   . Hypercholesteremia   . Osteopenia   . Breast thickening     right breast  . Angiomyolipoma     right  . Bowel obstruction     sigmoid ulceration and obstruction  . Hypertension   . Fibrocystic breast disease   . LBBB (left bundle branch block)     Past Surgical History  Procedure Laterality Date  . Breast excisional biopsy  1999  . Colectomy  12/2001    left sigmoid, obstruction/diverticulitis  . Nephrectomy  12/2001    right, angiomyolipoma  . Appendectomy  12/2001  . Cholecystectomy  2003  . Cataract extraction Bilateral 2004  . Total knee arthroplasty Left 03/16/2011    Dr Maureen Ralphs  . Hernia repair    . Colon surgery      bowel obstruction and ulceration  . Transthoracic echocardiogram  01/2011    EF 40-45%, mild conc LVH; RV mildly dilated; mild mitral annular calcif, mild MR; mild TR, RSVP elevated at 40-68mHg (mild pulm htn); AV mildly sclerotic   . Nm myocar perf wall motion  01/2011    dipyridamole myoview; normal pattern of perfusion in all regions, EF 61%, normal, low risk scan   . Cardiac catheterization  10/02/2007    moderate LV dysfunction, EF 35-40%, conc LVH, normal  coronaries, mild pulm HTN (Dr. Corky Downs)    No Known Allergies  Current Outpatient Prescriptions  Medication Sig Dispense Refill  . Calcium Carbonate-Vitamin D (CALCIUM + D PO) Take by mouth.    . carvedilol (COREG) 6.25 MG tablet Take 6.25 mg by mouth 2 (two) times daily with a meal.      . cholecalciferol (VITAMIN D) 1000 UNITS tablet Take 1,000 Units by mouth as needed.     Marland Kitchen lisinopril (PRINIVIL,ZESTRIL) 20 MG tablet Take 20 mg by mouth daily.      . predniSONE (DELTASONE) 10 MG tablet Take 1 tablet by  mouth daily.    . simvastatin (ZOCOR) 40 MG tablet Take 40 mg by mouth at bedtime.      . VENTOLIN HFA 108 (90 BASE) MCG/ACT inhaler Inhale 2 puffs into the lungs daily.     No current facility-administered medications for this visit.    Social History   Social History  . Marital Status: Married    Spouse Name: N/A  . Number of Children: 3  . Years of Education: N/A   Occupational History  .     Social History Main Topics  . Smoking status: Former Research scientist (life sciences)  . Smokeless tobacco: Never Used  . Alcohol Use: 0.6 oz/week    1 Glasses of wine per week     Comment: one glass daily  . Drug Use: No  . Sexual Activity: Yes    Birth Control/ Protection: Post-menopausal   Other Topics Concern  . Not on file   Social History Narrative   Socially she is very has 3 children, 5 grandchildren one great-grandchild. There is no tobacco or alcohol use. She does not routinely exercise.  Family History  Problem Relation Age of Onset  . Heart disease Mother     also stroke  . Heart disease Father     also stroke   . Lung cancer Sister   . Cancer Sister 73   ROS General: Negative; No fevers, chills, or night sweats;  HEENT: Negative; No changes in vision or hearing, sinus congestion, difficulty swallowing Pulmonary: Negative; No cough, wheezing, shortness of breath, hemoptysis Cardiovascular: Negative; No chest pain, presyncope, syncope, palpitations GI: Negative; No nausea, vomiting, diarrhea, or abdominal pain GU: Negative; No dysuria, hematuria, or difficulty voiding Musculoskeletal: Positive for degenerative disc disease; no myalgias, joint pain, or weakness Hematologic/Oncology: Negative; no easy bruising, bleeding Endocrine: Negative; no heat/cold intolerance; no diabetes Neuro: Negative; no changes in balance, headaches Skin: Negative; No rashes or skin lesions Psychiatric: Negative; No behavioral problems, depression Sleep: Negative; No snoring, daytime sleepiness,  hypersomnolence, bruxism, restless legs, hypnogognic hallucinations, no cataplexy Other comprehensive 14 point system review is negative.   PE BP 132/76 mmHg  Pulse 60  Ht '5\' 4"'  (1.626 m)  Wt 181 lb 3.2 oz (82.192 kg)  BMI 31.09 kg/m2  Repeat blood pressure by me was 136/78  Wt Readings from Last 3 Encounters:  09/12/15 181 lb 3.2 oz (82.192 kg)  02/15/15 185 lb 8 oz (84.142 kg)  02/15/14 186 lb 6.4 oz (84.55 kg)   General: Alert, oriented, no distress.  Skin: normal turgor, no rashes HEENT: Normocephalic, atraumatic. Pupils round and reactive; sclera anicteric;no lid lag. Extraocular muscles intact;; no xanthelasmas. Nose without nasal septal hypertrophy Mouth/Parynx benign; Mallinpatti scale 2 Neck: No JVD, no carotid bruits; normal carotid upstroke Lungs: clear to ausculatation and percussion; no wheezing or rales Chest wall: no tenderness to palpitation Heart: RRR, s1 s2 normal; 1/6 systolic murmur  in the aortic region;no diastolic murmur, rub thrills or heaves Abdomen: soft, nontender; no hepatosplenomehaly, BS+; abdominal aorta nontender and not dilated by palpation. Back: no CVA tenderness Pulses 2+ Extremities: no clubbing cyanosis or edema, Homan's sign negative  Neurologic: grossly nonfocal; cranial nerves grossly normal. Psychologic: normal affect and mood.  ECG (independently read by me): Normal sinus rhythm with mild sinus arrhythmia.  Left bundle branch block with repolarization changes.  02/15/2015 ECG (independently read by me): Normal sinus rhythm at 67.  Left bundle branch block with repolarization changes.  Premature atrial complex.  March 2015 ECG (independently read by me): Normal sinus rhythm with left bundle-branch block with a ventricular rate 69 beats per minute.  LABS: BMP Latest Ref Rng 03/18/2011 03/17/2011 03/08/2011  Glucose 70 - 99 mg/dL 130(H) 138(H) 104(H)  BUN 6 - 23 mg/dL '9 13 19  ' Creatinine 0.4 - 1.2 mg/dL 0.72 0.74 0.85  Sodium 135 - 145  mEq/L 138 138 139  Potassium 3.5 - 5.1 mEq/L 4.1 4.7 4.6  Chloride 96 - 112 mEq/L 106 105 104  CO2 19 - 32 mEq/L '29 26 27  ' Calcium 8.4 - 10.5 mg/dL 8.7 8.1(L) 9.6   Hepatic Function Latest Ref Rng 03/08/2011  Total Protein 6.0 - 8.3 g/dL 7.5  Albumin 3.5 - 5.2 g/dL 4.0  AST 0 - 37 U/L 33  ALT 0 - 35 U/L 24  Alk Phosphatase 39 - 117 U/L 50  Total Bilirubin 0.3 - 1.2 mg/dL 0.8   CBC Latest Ref Rng 03/19/2011 03/18/2011 03/17/2011  WBC 4.0 - 10.5 K/uL 11.1(H) 10.7(H) 10.1  Hemoglobin 12.0 - 15.0 g/dL 9.6(L) 10.2(L) 10.8(L)  Hematocrit 36.0 - 46.0 % 29.6(L) 31.0(L) 33.2(L)  Platelets 150 - 400 K/uL 259 247 256   Lab Results  Component Value Date   MCV 95.2 03/19/2011   MCV 95.7 03/18/2011   MCV 96.8 03/17/2011   No results found for: TSH.  Lipid Panel  No results found for: CHOL, TRIG, HDL, CHOLHDL, VLDL, LDLCALC, LDLDIRECT   RADIOLOGY: No results found.    ASSESSMENT AND PLAN: Ms. Ilianna Bown is an 81 years old female who has chronic left bundle branch block as well as hypertension.  Cardiac catheterization in 2008 showed normal coronary arteries.  There was mild pulmonary hypertension.  At that time, ejection fraction was 35-40%.  Her ejection fraction subsequently improved on a follow-up echo in 2012 at 40-45%.  I reviewed her most recent echo Doppler study with her from March 2016.  This essentially is unchanged.  Her blood pressure today is stable on current therapy consisting of lisinopril 20 mg, and carvedilol 6.25 mg twice a day.  She recently had a episode of pneumonia for which she will be treated with 2 weeks of anabiotic therapy.  She is tolerating simvastatin 40 mg for hyperlipidemia.  Dr. Sandi Mariscal checks her routine laboratory.  I will ask that any recent blood work be sent to our office for my review.  Cardiac wise she is stable.  Her left bundle-branch block is chronic.  I will see her in one year for reevaluation or sooner if problems arise.    Time spent: 25  minutes  Troy Sine, MD, [facility]  09/14/2015 1:03 PM

## 2015-09-21 ENCOUNTER — Ambulatory Visit
Admission: RE | Admit: 2015-09-21 | Discharge: 2015-09-21 | Disposition: A | Discharge status: Home / Self Care | Payer: Medicare Other | Source: Ambulatory Visit

## 2015-09-21 ENCOUNTER — Other Ambulatory Visit: Payer: Self-pay

## 2015-09-21 DIAGNOSIS — Z1231 Encounter for screening mammogram for malignant neoplasm of breast: Secondary | ICD-10-CM

## 2015-09-22 ENCOUNTER — Encounter: Payer: Self-pay | Admitting: Cardiovascular Disease

## 2015-10-03 ENCOUNTER — Other Ambulatory Visit: Payer: Self-pay | Admitting: Family Medicine

## 2015-10-03 ENCOUNTER — Ambulatory Visit
Admission: RE | Admit: 2015-10-03 | Discharge: 2015-10-03 | Disposition: A | Discharge status: Home / Self Care | Payer: Medicare Other | Source: Ambulatory Visit | Attending: Family Medicine | Admitting: Family Medicine

## 2015-10-03 DIAGNOSIS — Z09 Encounter for follow-up examination after completed treatment for conditions other than malignant neoplasm: Secondary | ICD-10-CM

## 2015-10-31 ENCOUNTER — Other Ambulatory Visit: Payer: Self-pay | Admitting: Family Medicine

## 2015-10-31 ENCOUNTER — Ambulatory Visit
Admission: RE | Admit: 2015-10-31 | Discharge: 2015-10-31 | Disposition: A | Discharge status: Home / Self Care | Payer: Medicare Other | Source: Ambulatory Visit | Attending: Family Medicine | Admitting: Family Medicine

## 2015-10-31 DIAGNOSIS — J189 Pneumonia, unspecified organism: Secondary | ICD-10-CM

## 2015-10-31 DIAGNOSIS — R9389 Abnormal findings on diagnostic imaging of other specified body structures: Secondary | ICD-10-CM

## 2015-11-07 ENCOUNTER — Ambulatory Visit
Admission: RE | Admit: 2015-11-07 | Discharge: 2015-11-07 | Disposition: A | Discharge status: Home / Self Care | Payer: Medicare Other | Source: Ambulatory Visit | Attending: Family Medicine | Admitting: Family Medicine

## 2015-11-07 DIAGNOSIS — R9389 Abnormal findings on diagnostic imaging of other specified body structures: Secondary | ICD-10-CM

## 2015-11-18 ENCOUNTER — Other Ambulatory Visit: Payer: Self-pay | Admitting: Family Medicine

## 2015-11-18 DIAGNOSIS — R0989 Other specified symptoms and signs involving the circulatory and respiratory systems: Secondary | ICD-10-CM

## 2015-11-18 DIAGNOSIS — E041 Nontoxic single thyroid nodule: Secondary | ICD-10-CM

## 2015-11-24 ENCOUNTER — Ambulatory Visit
Admission: RE | Admit: 2015-11-24 | Discharge: 2015-11-24 | Disposition: A | Discharge status: Home / Self Care | Payer: Medicare Other | Source: Ambulatory Visit | Attending: Family Medicine | Admitting: Family Medicine

## 2015-11-24 DIAGNOSIS — E041 Nontoxic single thyroid nodule: Secondary | ICD-10-CM

## 2015-11-24 DIAGNOSIS — R0989 Other specified symptoms and signs involving the circulatory and respiratory systems: Secondary | ICD-10-CM

## 2015-11-28 ENCOUNTER — Other Ambulatory Visit: Payer: Self-pay | Admitting: Family Medicine

## 2015-11-28 DIAGNOSIS — E041 Nontoxic single thyroid nodule: Secondary | ICD-10-CM

## 2015-12-01 ENCOUNTER — Other Ambulatory Visit (HOSPITAL_COMMUNITY)
Admission: RE | Admit: 2015-12-01 | Discharge: 2015-12-01 | Disposition: A | Discharge status: Home / Self Care | Payer: Medicare Other | Source: Ambulatory Visit | Attending: Radiology | Admitting: Radiology

## 2015-12-01 ENCOUNTER — Ambulatory Visit
Admission: RE | Admit: 2015-12-01 | Discharge: 2015-12-01 | Disposition: A | Discharge status: Home / Self Care | Payer: Medicare Other | Source: Ambulatory Visit | Attending: Family Medicine | Admitting: Family Medicine

## 2015-12-01 DIAGNOSIS — E041 Nontoxic single thyroid nodule: Secondary | ICD-10-CM

## 2016-01-10 IMAGING — CR DG CHEST 2V
2 series · 2 of 2 positions shown · non-contrast
Comparison: 09/02/2015.  03/08/2011.

CLINICAL DATA: Evaluate for pneumonia.

EXAM:
CHEST  2 VIEW

[w chest pa]
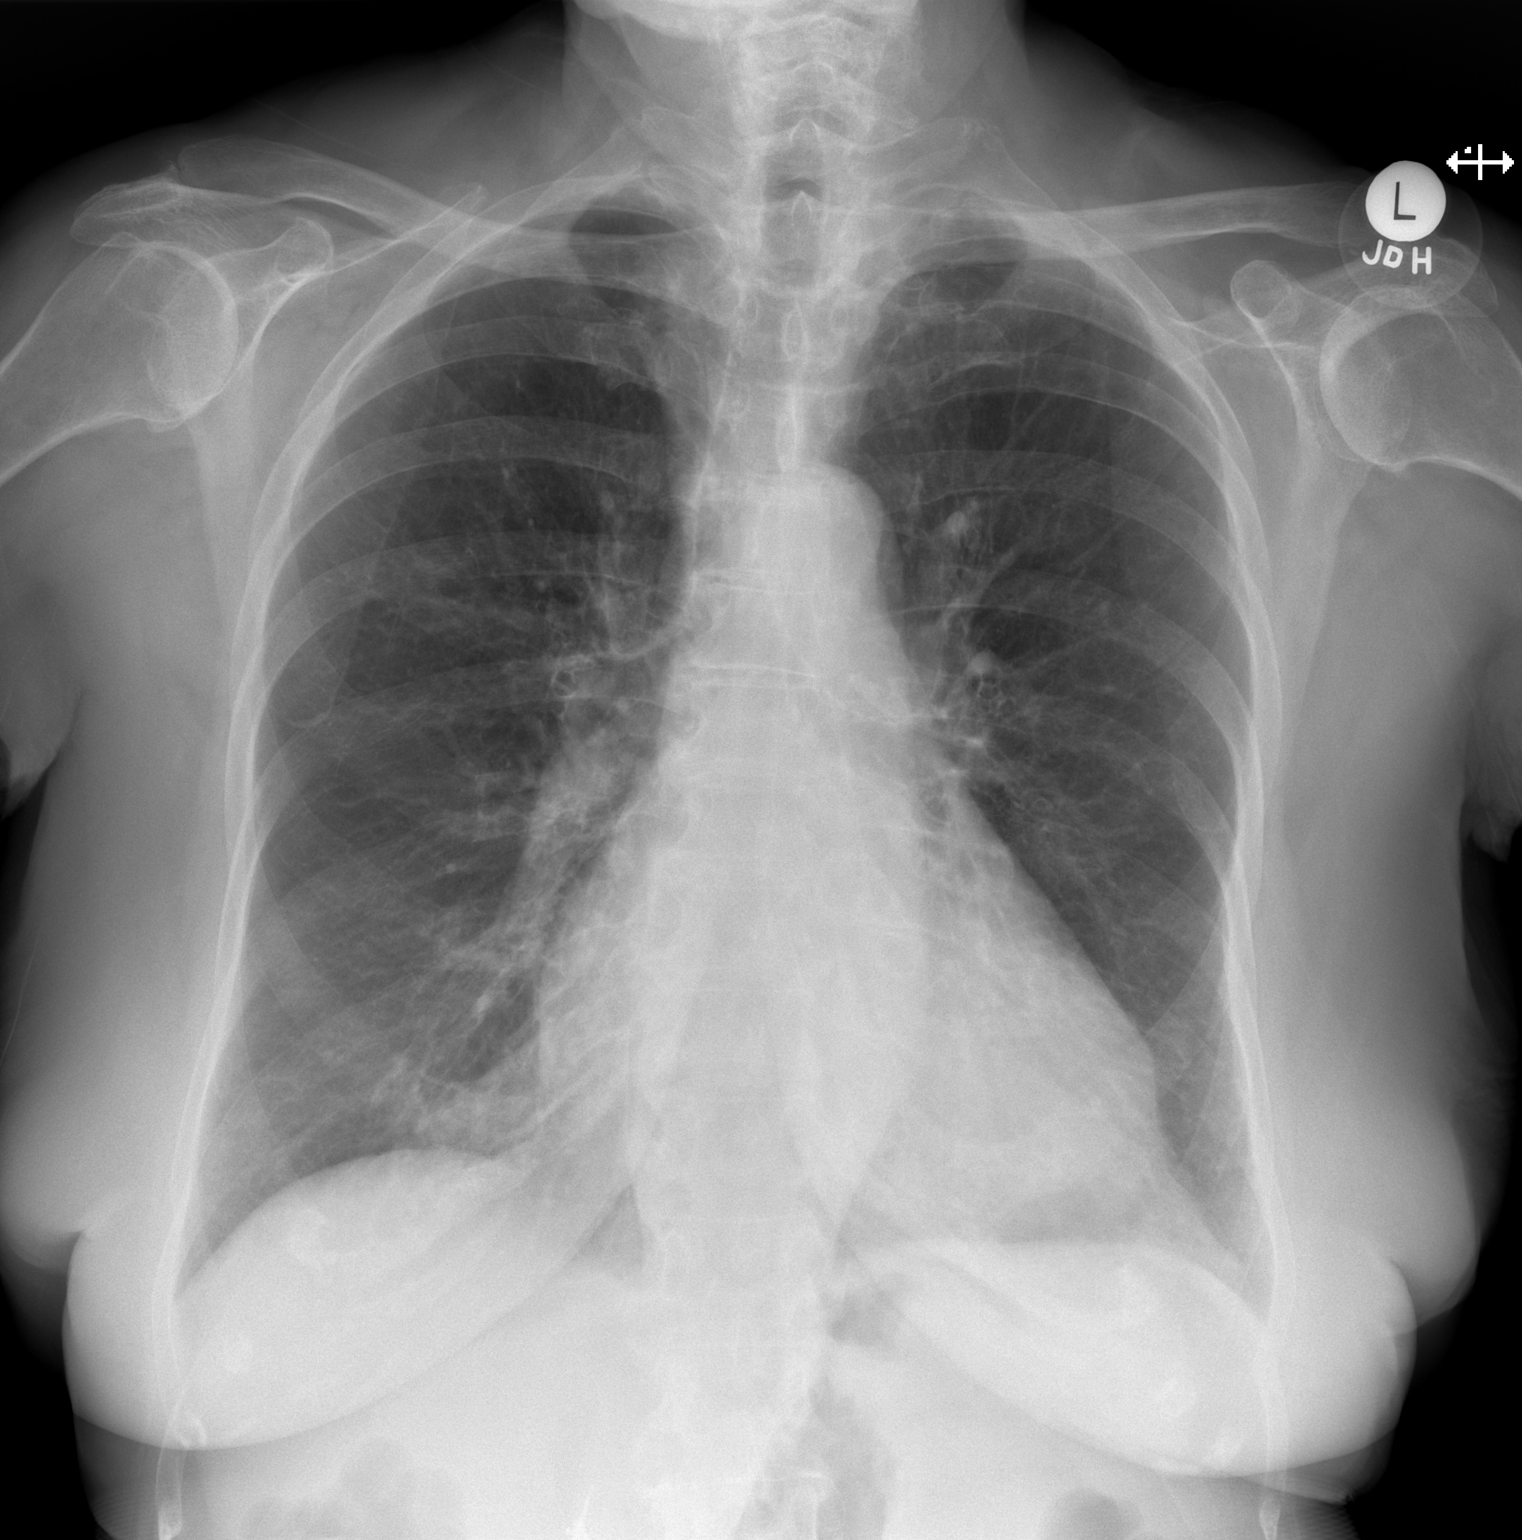

[w chest lat]
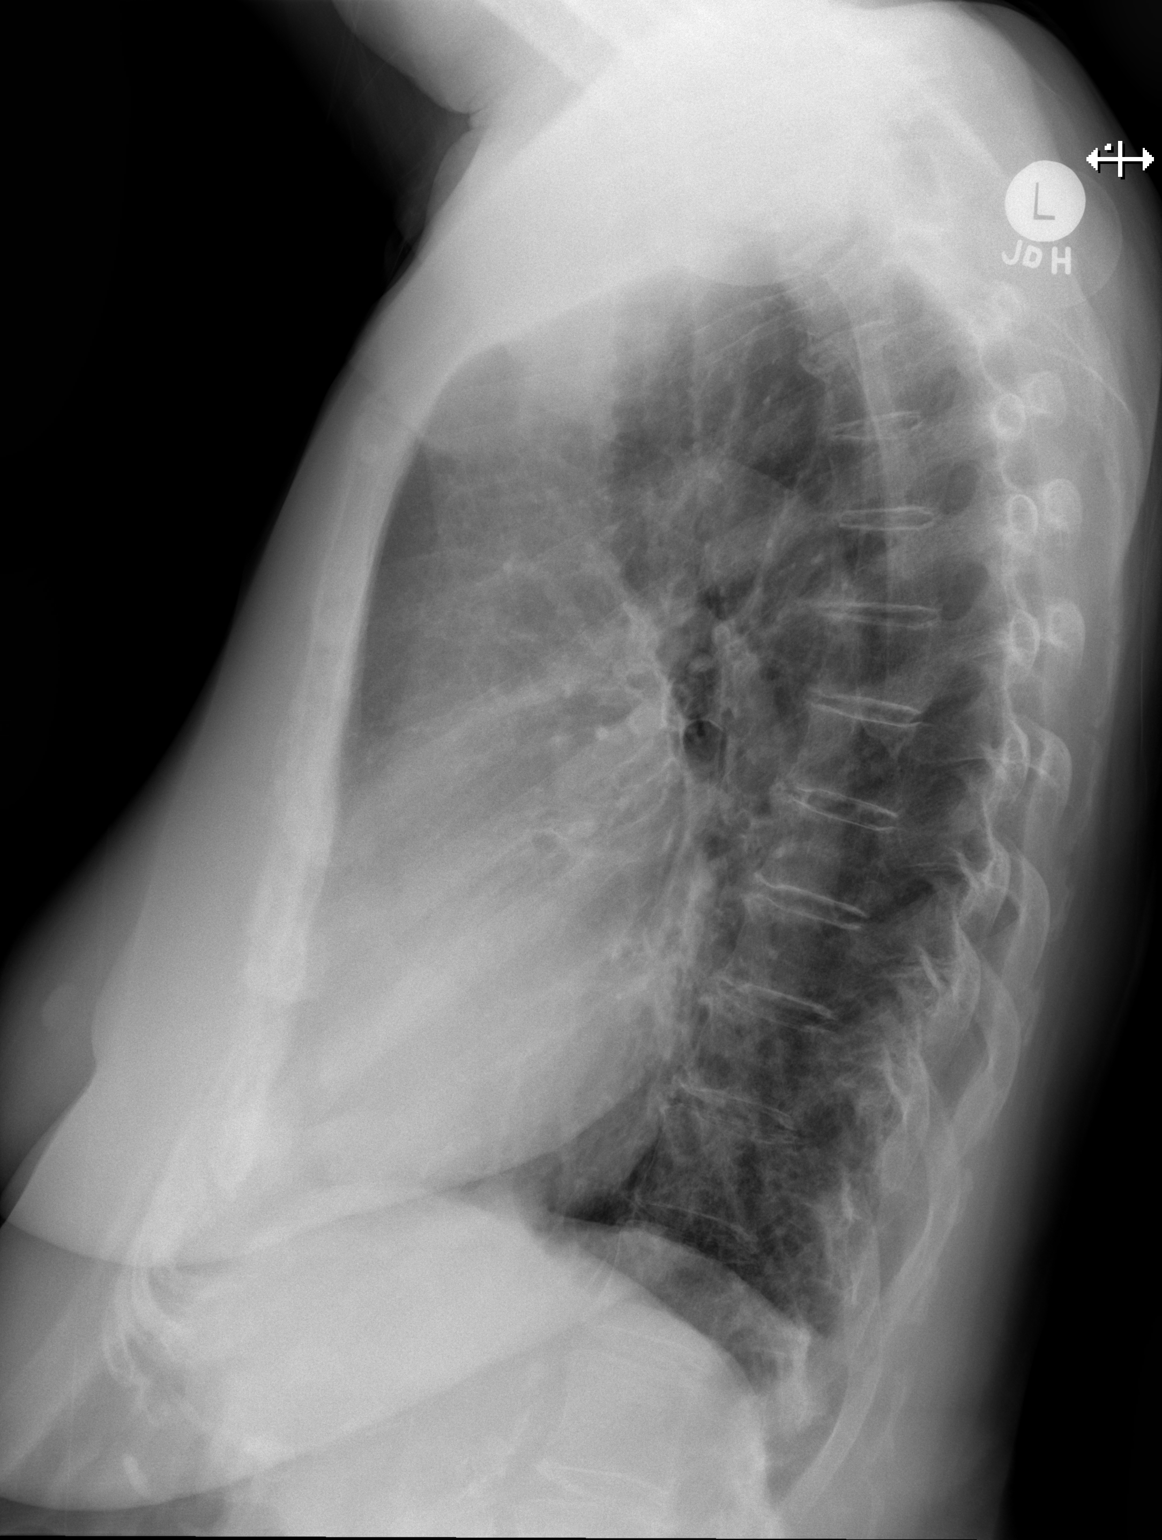

[2 of 2 positions shown; findings below may reference images not displayed]

FINDINGS: Mediastinum and hilar structures normal. Cardiomegaly. Normal
pulmonary vascularity. Persistent mild right base infiltrate noted
with slight interim clearing. No pleural effusion or pneumothorax
IMPRESSION: 1.  Partial clearing of right base infiltrate.

2.  Stable cardiomegaly .

## 2016-06-11 ENCOUNTER — Other Ambulatory Visit (HOSPITAL_COMMUNITY): Payer: Self-pay | Admitting: Family Medicine

## 2016-06-11 ENCOUNTER — Ambulatory Visit (HOSPITAL_COMMUNITY)
Admission: RE | Admit: 2016-06-11 | Discharge: 2016-06-11 | Disposition: A | Discharge status: Home / Self Care | Payer: Medicare Other | Source: Ambulatory Visit | Attending: Surgery | Admitting: Surgery

## 2016-06-11 DIAGNOSIS — E7800 Pure hypercholesterolemia, unspecified: Secondary | ICD-10-CM | POA: Insufficient documentation

## 2016-06-11 DIAGNOSIS — I10 Essential (primary) hypertension: Secondary | ICD-10-CM | POA: Diagnosis not present

## 2016-06-11 DIAGNOSIS — I272 Other secondary pulmonary hypertension: Secondary | ICD-10-CM | POA: Diagnosis not present

## 2016-06-11 DIAGNOSIS — R0989 Other specified symptoms and signs involving the circulatory and respiratory systems: Secondary | ICD-10-CM

## 2016-06-11 DIAGNOSIS — I447 Left bundle-branch block, unspecified: Secondary | ICD-10-CM | POA: Insufficient documentation

## 2016-06-11 LAB — VAS US CAROTID
LCCADSYS: -68 cm/s
LEFT ECA DIAS: 6 cm/s
LICADDIAS: -25 cm/s
LICAPDIAS: 18 cm/s
Left CCA dist dias: -13 cm/s
Left CCA prox dias: 17 cm/s
Left CCA prox sys: 78 cm/s
Left ICA dist sys: -84 cm/s
Left ICA prox sys: 61 cm/s
RCCADSYS: -88 cm/s
RCCAPDIAS: 18 cm/s
RIGHT CCA MID DIAS: 18 cm/s
RIGHT ECA DIAS: -8 cm/s
Right CCA prox sys: 85 cm/s

## 2016-08-15 ENCOUNTER — Other Ambulatory Visit: Payer: Self-pay | Admitting: Family Medicine

## 2016-08-15 DIAGNOSIS — Z1231 Encounter for screening mammogram for malignant neoplasm of breast: Secondary | ICD-10-CM

## 2016-09-21 ENCOUNTER — Ambulatory Visit
Admission: RE | Admit: 2016-09-21 | Discharge: 2016-09-21 | Disposition: A | Discharge status: Home / Self Care | Payer: Medicare Other | Source: Ambulatory Visit | Attending: Family Medicine | Admitting: Family Medicine

## 2016-09-21 DIAGNOSIS — Z1231 Encounter for screening mammogram for malignant neoplasm of breast: Secondary | ICD-10-CM

## 2016-10-04 ENCOUNTER — Ambulatory Visit (INDEPENDENT_AMBULATORY_CARE_PROVIDER_SITE_OTHER): Payer: Medicare Other | Admitting: Cardiovascular Disease

## 2016-10-04 ENCOUNTER — Encounter: Payer: Self-pay | Admitting: Cardiovascular Disease

## 2016-10-04 VITALS — BP 134/68 | HR 60 | Ht 64.0 in | Wt 185.4 lb

## 2016-10-04 DIAGNOSIS — I428 Other cardiomyopathies: Secondary | ICD-10-CM | POA: Diagnosis not present

## 2016-10-04 DIAGNOSIS — I447 Left bundle-branch block, unspecified: Secondary | ICD-10-CM | POA: Diagnosis not present

## 2016-10-04 DIAGNOSIS — E7800 Pure hypercholesterolemia, unspecified: Secondary | ICD-10-CM | POA: Diagnosis not present

## 2016-10-04 DIAGNOSIS — I10 Essential (primary) hypertension: Secondary | ICD-10-CM

## 2016-10-04 NOTE — Patient Instructions (Addendum)
Your physician wants you to follow-up in: 1 year or sooner if needed. You will receive a reminder letter in the mail two months in advance. If you don't receive a letter, please call our office to schedule the follow-up appointment.   If you need a refill on your cardiac medications before your next appointment, please call your pharmacy.   

## 2016-10-04 NOTE — Progress Notes (Signed)
Patient ID: Rhonda Burke, female   DOB: 22-Nov-1927, 82 y.o.   MRN: [id number]   Primary MD: Dr. Derinda Late  HPI: Rhonda Burke is a 82 y.o. female who presents to the office for a 13 month cardiology evaluation.  Rhonda Burke has a history of left bundle branch block which was discovered when she presented for a routine colonoscopy and had significant ectopy an ECG in 2008.  I saw her for cardiology evaluation and echo Doppler study showed an ejection fraction of 35% with mild pulmonary hypertension. A nuclear perfusion study raised the possibility of mild ischemia and consequently she underwent a right and left heart catheterization which showed an ejection fraction of 35-40% and mild pulmonary hypertension with a PA pressure 35/15. Her LV pressure was 135/13. She was treated with medical therapy. An echo Doppler study in February 2012 showed an ejection fraction of 40-45% and she had septal wall motion abnormality due to her conduction abnormality. There was still evidence of pulmonary hypertension with estimated RV systolic pressure 41 mm there was evidence for aortic valve sclerosis.  Additional problems include hyperlipidemia and hypertension. She has tolerated a left knee replacement surgery by Dr. Sandie Ano 2012 about cardiovascular problems. Her last stress test was done preoperatively in February 2012 which showed normal perfusion.  She is status post right nephrectomy for angiomyolipoma.  On 02/23/2015 she underwent a follow-up echo Doppler study.  This showed an ejection fraction of 40-45%.  There was grade 1 diastolic dysfunction.  There was trivial aortic insufficiency.  There was mild pulmonary hypertension with a PA pressure 35 mm.  In 2016 she suffered pneumonia which resolved with antibiotic therapy.  Over the past year, she denies any exertionally precipitated chest tightness, presyncope or syncope and is unaware of any palpitations.  She denies any blood in her stool or urine.   She presents for evaluation.  Past Medical History:  Diagnosis Date  . Angiomyolipoma    right  . Aortic sclerosis   . Asthma, mild intermittent   . Bilateral hearing loss   . Bowel obstruction    sigmoid ulceration and obstruction  . Breast thickening    right breast  . Degenerative disc disease    C-spine  . Fibrocystic breast disease   . Hypercholesteremia   . Hypertension   . LBBB (left bundle branch block)   . Mitral regurgitation   . Osteopenia   . Pulmonary hypertension   . Venous insufficiency     Past Surgical History:  Procedure Laterality Date  . APPENDECTOMY  12/2001  . BREAST EXCISIONAL BIOPSY  1999  . CARDIAC CATHETERIZATION  10/02/2007   moderate LV dysfunction, EF 35-40%, conc LVH, normal coronaries, mild pulm HTN (Dr. Corky Downs)  . CATARACT EXTRACTION Bilateral 2004  . CHOLECYSTECTOMY  2003  . COLECTOMY  12/2001   left sigmoid, obstruction/diverticulitis  . COLON SURGERY     bowel obstruction and ulceration  . HERNIA REPAIR    . NEPHRECTOMY  12/2001   right, angiomyolipoma  . NM MYOCAR PERF WALL MOTION  01/2011   dipyridamole myoview; normal pattern of perfusion in all regions, EF 61%, normal, low risk scan   . TOTAL KNEE ARTHROPLASTY Left 03/16/2011   Dr Maureen Ralphs  . TRANSTHORACIC ECHOCARDIOGRAM  01/2011   EF 40-45%, mild conc LVH; RV mildly dilated; mild mitral annular calcif, mild MR; mild TR, RSVP elevated at 40-69mHg (mild pulm htn); AV mildly sclerotic     No Known Allergies  Current Outpatient Prescriptions  Medication  Sig Dispense Refill  . aspirin EC 81 MG tablet Take 81 mg by mouth daily.    . carvedilol (COREG) 6.25 MG tablet Take 6.25 mg by mouth 2 (two) times daily with a meal.      . lisinopril (PRINIVIL,ZESTRIL) 20 MG tablet Take 20 mg by mouth daily.      . Multiple Vitamins-Minerals (MULTIVITAMIN WITH MINERALS) tablet Take 1 tablet by mouth daily.    . simvastatin (ZOCOR) 40 MG tablet Take 40 mg by mouth at bedtime.       No current  facility-administered medications for this visit.     Social History   Social History  . Marital status: Married    Spouse name: N/A  . Number of children: 3  . Years of education: N/A   Occupational History  .  Retired   Social History Main Topics  . Smoking status: Former Research scientist (life sciences)  . Smokeless tobacco: Never Used  . Alcohol use 0.6 oz/week    1 Glasses of wine per week     Comment: one glass daily  . Drug use: No  . Sexual activity: Yes    Birth control/ protection: Post-menopausal   Other Topics Concern  . Not on file   Social History Narrative  . No narrative on file   Socially she is very has 3 children, 5 grandchildren one great-grandchild. There is no tobacco or alcohol use. She does not routinely exercise.  Family History  Problem Relation Age of Onset  . Heart disease Mother     also stroke  . Heart disease Father     also stroke   . Lung cancer Sister   . Cancer Sister 71   ROS General: Negative; No fevers, chills, or night sweats;  HEENT: Negative; No changes in vision or hearing, sinus congestion, difficulty swallowing Pulmonary: Negative; No cough, wheezing, shortness of breath, hemoptysis Cardiovascular: Negative; No chest pain, presyncope, syncope, palpitations GI: Negative; No nausea, vomiting, diarrhea, or abdominal pain GU: Negative; No dysuria, hematuria, or difficulty voiding Musculoskeletal: Positive for degenerative disc disease; no myalgias, joint pain, or weakness Hematologic/Oncology: Negative; no easy bruising, bleeding Endocrine: Negative; no heat/cold intolerance; no diabetes Neuro: Negative; no changes in balance, headaches Skin: Negative; No rashes or skin lesions Psychiatric: Negative; No behavioral problems, depression Sleep: Negative; No snoring, daytime sleepiness, hypersomnolence, bruxism, restless legs, hypnogognic hallucinations, no cataplexy Other comprehensive 14 point system review is negative.   PE BP 134/68   Pulse 60    Ht '5\' 4"'  (1.626 m)   Wt 185 lb 6.4 oz (84.1 kg)   BMI 31.82 kg/m    Repeat blood pressure by me was 130/70  Wt Readings from Last 3 Encounters:  10/04/16 185 lb 6.4 oz (84.1 kg)  09/12/15 181 lb 3.2 oz (82.2 kg)  02/15/15 185 lb 8 oz (84.1 kg)   General: Alert, oriented, no distress.  Skin: normal turgor, no rashes HEENT: Normocephalic, atraumatic. Pupils round and reactive; sclera anicteric;no lid lag. Extraocular muscles intact;; no xanthelasmas. Nose without nasal septal hypertrophy Mouth/Parynx benign; Mallinpatti scale 2 Neck: No JVD, no carotid bruits; normal carotid upstroke Lungs: clear to ausculatation and percussion; no wheezing or rales Chest wall: no tenderness to palpitation Heart: RRR, s1 s2 normal; 1/6 systolic murmur in the aortic region;no diastolic murmur, rub thrills or heaves Abdomen: soft, nontender; no hepatosplenomehaly, BS+; abdominal aorta nontender and not dilated by palpation. Back: no CVA tenderness Pulses 2+ Extremities: no clubbing cyanosis or edema, Homan's sign negative  Neurologic:  grossly nonfocal; cranial nerves grossly normal. Psychologic: normal affect and mood.  ECG (independently read by me): Normal sinus rhythm at 60 bpm.  Left bundle-branch block with repolarization changes.  Normal intervals.  September 2000 ECG (independently read by me): Normal sinus rhythm with mild sinus arrhythmia.  Left bundle branch block with repolarization changes.  02/15/2015 ECG (independently read by me): Normal sinus rhythm at 67.  Left bundle branch block with repolarization changes.  Premature atrial complex.  March 2015 ECG (independently read by me): Normal sinus rhythm with left bundle-branch block with a ventricular rate 69 beats per minute.  LABS: BMP Latest Ref Rng & Units 03/18/2011 03/17/2011 03/08/2011  Glucose 70 - 99 mg/dL 130(H) 138(H) 104(H)  BUN 6 - 23 mg/dL '9 13 19  ' Creatinine 0.4 - 1.2 mg/dL 0.72 0.74 0.85  Sodium 135 - 145 mEq/L 138 138  139  Potassium 3.5 - 5.1 mEq/L 4.1 4.7 4.6  Chloride 96 - 112 mEq/L 106 105 104  CO2 19 - 32 mEq/L '29 26 27  ' Calcium 8.4 - 10.5 mg/dL 8.7 8.1(L) 9.6   Hepatic Function Latest Ref Rng & Units 03/08/2011  Total Protein 6.0 - 8.3 g/dL 7.5  Albumin 3.5 - 5.2 g/dL 4.0  AST 0 - 37 U/L 33  ALT 0 - 35 U/L 24  Alk Phosphatase 39 - 117 U/L 50  Total Bilirubin 0.3 - 1.2 mg/dL 0.8   CBC Latest Ref Rng & Units 03/19/2011 03/18/2011 03/17/2011  WBC 4.0 - 10.5 K/uL 11.1(H) 10.7(H) 10.1  Hemoglobin 12.0 - 15.0 g/dL 9.6(L) 10.2(L) 10.8(L)  Hematocrit 36.0 - 46.0 % 29.6(L) 31.0(L) 33.2(L)  Platelets 150 - 400 K/uL 259 247 256   Lab Results  Component Value Date   MCV 95.2 03/19/2011   MCV 95.7 03/18/2011   MCV 96.8 03/17/2011   No results found for: TSH.  Lipid Panel  No results found for: CHOL, TRIG, HDL, CHOLHDL, VLDL, LDLCALC, LDLDIRECT   RADIOLOGY: No results found.    ASSESSMENT AND PLAN: Rhonda Burke is an 82 year old female who has chronic left bundle branch block as well as hypertension.  Cardiac catheterization in 2008 showed normal coronary arteries.  There was mild pulmonary hypertension.  At that time, ejection fraction was 35-40%.  Her ejection fraction subsequently improved on a follow-up echo in 2012 at 40-45% and remained stable on her most recent echo from March 2016.   Her blood pressure today is stable on current therapy consisting of lisinopril 20 mg, and carvedilol 6.25 mg twice a day.  She is tolerating simvastatin 40 mg for hyperlipidemia.  Dr. Sandi Mariscal checks her routine laboratory.  She had undergone a carotid duplex study on 06/11/2016, which was ordered by Dr. Sandi Mariscal.  This was normal with no significant stenoses of the bilateral external common carotid arteries.  There was less than 1 a 39% bilateral proximal internal carotid narrowings.  She denies any chest pain.  There are no CHF symptoms.  BMI is increased at 31.8 compatible with mild obesity.  Mild weight loss  was recommended.  She will continue her current medical regimen.  Follow-up laboratory will be done by Dr. Sandi Mariscal.  I will see her in one year for reevaluation.    Time spent: 25 minutes  Troy Sine, MD, [facility]  10/04/2016 12:54 PM

## 2016-12-20 DIAGNOSIS — I480 Paroxysmal atrial fibrillation: Secondary | ICD-10-CM | POA: Insufficient documentation

## 2016-12-24 ENCOUNTER — Ambulatory Visit (INDEPENDENT_AMBULATORY_CARE_PROVIDER_SITE_OTHER): Payer: Medicare Other

## 2016-12-24 DIAGNOSIS — I480 Paroxysmal atrial fibrillation: Secondary | ICD-10-CM | POA: Diagnosis not present

## 2017-08-14 ENCOUNTER — Other Ambulatory Visit: Payer: Self-pay | Admitting: Family Medicine

## 2017-08-14 DIAGNOSIS — Z1231 Encounter for screening mammogram for malignant neoplasm of breast: Secondary | ICD-10-CM

## 2017-10-08 ENCOUNTER — Ambulatory Visit
Admission: RE | Admit: 2017-10-08 | Discharge: 2017-10-08 | Disposition: A | Discharge status: Home / Self Care | Payer: Medicare Other | Source: Ambulatory Visit | Attending: Family Medicine | Admitting: Family Medicine

## 2017-10-08 DIAGNOSIS — Z1231 Encounter for screening mammogram for malignant neoplasm of breast: Secondary | ICD-10-CM

## 2018-01-06 DIAGNOSIS — M7138 Other bursal cyst, other site: Secondary | ICD-10-CM | POA: Insufficient documentation

## 2018-03-10 ENCOUNTER — Telehealth: Payer: Self-pay | Admitting: Cardiovascular Disease

## 2018-03-10 NOTE — Telephone Encounter (Signed)
Received incoming records from [facility] for upcoming appointment on 03/20/18 @ 3:40pm with Dr. Claiborne Billings. Records given to [facility] in Medical Records. 03/10/18 ab

## 2018-03-20 ENCOUNTER — Ambulatory Visit (INDEPENDENT_AMBULATORY_CARE_PROVIDER_SITE_OTHER): Payer: Medicare Other | Admitting: Cardiovascular Disease

## 2018-03-20 ENCOUNTER — Encounter: Payer: Self-pay | Admitting: Cardiovascular Disease

## 2018-03-20 VITALS — BP 140/72 | HR 75 | Ht 64.0 in | Wt 183.0 lb

## 2018-03-20 DIAGNOSIS — I428 Other cardiomyopathies: Secondary | ICD-10-CM

## 2018-03-20 DIAGNOSIS — R0989 Other specified symptoms and signs involving the circulatory and respiratory systems: Secondary | ICD-10-CM | POA: Diagnosis not present

## 2018-03-20 DIAGNOSIS — I447 Left bundle-branch block, unspecified: Secondary | ICD-10-CM

## 2018-03-20 DIAGNOSIS — E7800 Pure hypercholesterolemia, unspecified: Secondary | ICD-10-CM | POA: Diagnosis not present

## 2018-03-20 DIAGNOSIS — I10 Essential (primary) hypertension: Secondary | ICD-10-CM

## 2018-03-20 NOTE — Progress Notes (Signed)
Patient ID: Rhonda Burke, female   DOB: Apr 18, 1927, 84 y.o.   MRN: [id number]   Primary MD: Dr. Derinda Late  HPI: Rhonda Burke is a 84 y.o. female who presents to the office for an 70 month cardiology evaluation.  Rhonda Burke has a history of left bundle branch block which was discovered when she presented for a routine colonoscopy and had significant ectopy an ECG in 2008.  I saw her for cardiology evaluation and echo Doppler study showed an ejection fraction of 35% with mild pulmonary hypertension. A nuclear perfusion study raised the possibility of mild ischemia and consequently she underwent a right and left heart catheterization which showed an ejection fraction of 35-40% and mild pulmonary hypertension with a PA pressure 35/15. Her LV pressure was 135/13. She was treated with medical therapy. An echo Doppler study in February 2012 showed an ejection fraction of 40-45% and she had septal wall motion abnormality due to her conduction abnormality. There was still evidence of pulmonary hypertension with estimated RV systolic pressure 41 mm there was evidence for aortic valve sclerosis.  Additional problems include hyperlipidemia and hypertension. She has tolerated a left knee replacement surgery by Dr. Sandie Ano 2012 about cardiovascular problems. Her last stress test was done preoperatively in February 2012 which showed normal perfusion.  She is status post right nephrectomy for angiomyolipoma.  On 02/23/2015 she underwent a follow-up echo Doppler study.  This showed an ejection fraction of 40-45%.  There was grade 1 diastolic dysfunction.  There was trivial aortic insufficiency.  There was mild pulmonary hypertension with a PA pressure 35 mm.  I last saw her in October 2017.  Had undergone carotid duplex evaluation in June 2017 which showed 39% bilateral proximal internal carotid artery stenoses.  Had a cardiac monitor in January 2018 which showed sinus rhythm throughout the monitoring.  With  an average heart rate at 71 bpm.  There was a rare PAC.  And without chest pain.  She has had some issues with her leg.  She is status post left knee replacement and has undergone injections into the other knee.  Time she notes trivial edema at the right ankle.  She is followed by Dr. Sandi Mariscal who checks her laboratory.  She presents for reevaluation.  Past Medical History:  Diagnosis Date  . Angiomyolipoma    right  . Aortic sclerosis   . Asthma, mild intermittent   . Bilateral hearing loss   . Bowel obstruction (HCC)    sigmoid ulceration and obstruction  . Breast thickening    right breast  . Degenerative disc disease    C-spine  . Fibrocystic breast disease   . Hypercholesteremia   . Hypertension   . LBBB (left bundle branch block)   . Mitral regurgitation   . Osteopenia   . Pulmonary hypertension ([place])   . Venous insufficiency     Past Surgical History:  Procedure Laterality Date  . APPENDECTOMY  12/2001  . BREAST EXCISIONAL BIOPSY Right 1999  . CARDIAC CATHETERIZATION  10/02/2007   moderate LV dysfunction, EF 35-40%, conc LVH, normal coronaries, mild pulm HTN (Dr. Corky Downs)  . CATARACT EXTRACTION Bilateral 2004  . CHOLECYSTECTOMY  2003  . COLECTOMY  12/2001   left sigmoid, obstruction/diverticulitis  . COLON SURGERY     bowel obstruction and ulceration  . HERNIA REPAIR    . NEPHRECTOMY  12/2001   right, angiomyolipoma  . NM MYOCAR PERF WALL MOTION  01/2011   dipyridamole myoview; normal pattern of perfusion in  all regions, EF 61%, normal, low risk scan   . TOTAL KNEE ARTHROPLASTY Left 03/16/2011   Dr Maureen Ralphs  . TRANSTHORACIC ECHOCARDIOGRAM  01/2011   EF 40-45%, mild conc LVH; RV mildly dilated; mild mitral annular calcif, mild MR; mild TR, RSVP elevated at 40-36mHg (mild pulm htn); AV mildly sclerotic     No Known Allergies  Current Outpatient Medications  Medication Sig Dispense Refill  . aspirin EC 81 MG tablet Take 81 mg by mouth daily.    . carvedilol (COREG)  6.25 MG tablet Take 6.25 mg by mouth 2 (two) times daily with a meal.      . lisinopril (PRINIVIL,ZESTRIL) 20 MG tablet Take 20 mg by mouth daily.      . Multiple Vitamins-Minerals (MULTIVITAMIN WITH MINERALS) tablet Take 1 tablet by mouth daily.    . simvastatin (ZOCOR) 40 MG tablet Take 40 mg by mouth at bedtime.       No current facility-administered medications for this visit.     Social History   Socioeconomic History  . Marital status: Married    Spouse name: Not on file  . Number of children: 3  . Years of education: Not on file  . Highest education level: Not on file  Occupational History    Employer: RETIRED  Social Needs  . Financial resource strain: Not on file  . Food insecurity:    Worry: Not on file    Inability: Not on file  . Transportation needs:    Medical: Not on file    Non-medical: Not on file  Tobacco Use  . Smoking status: Former SResearch scientist (life sciences) . Smokeless tobacco: Never Used  Substance and Sexual Activity  . Alcohol use: Yes    Alcohol/week: 0.6 oz    Types: 1 Glasses of wine per week    Comment: one glass daily  . Drug use: No  . Sexual activity: Yes    Birth control/protection: Post-menopausal  Lifestyle  . Physical activity:    Days per week: Not on file    Minutes per session: Not on file  . Stress: Not on file  Relationships  . Social connections:    Talks on phone: Not on file    Gets together: Not on file    Attends religious service: Not on file    Active member of club or organization: Not on file    Attends meetings of clubs or organizations: Not on file    Relationship status: Not on file  . Intimate partner violence:    Fear of current or ex partner: Not on file    Emotionally abused: Not on file    Physically abused: Not on file    Forced sexual activity: Not on file  Other Topics Concern  . Not on file  Social History Narrative  . Not on file   Socially she is very has 3 children, 5 grandchildren one great-grandchild. There is  no tobacco or alcohol use. She does not routinely exercise.  Family History  Problem Relation Age of Onset  . Heart disease Mother        also stroke  . Heart disease Father        also stroke   . Lung cancer Sister   . Cancer Sister [id number]  ROS General: Negative; No fevers, chills, or night sweats;  HEENT: Negative; No changes in vision or hearing, sinus congestion, difficulty swallowing Pulmonary: Negative; No cough, wheezing, shortness of breath, hemoptysis Cardiovascular: Negative; No chest pain,  presyncope, syncope, palpitations GI: Negative; No nausea, vomiting, diarrhea, or abdominal pain GU: Negative; No dysuria, hematuria, or difficulty voiding Musculoskeletal: Positive for degenerative disc disease; discomfort Hematologic/Oncology: Negative; no easy bruising, bleeding Endocrine: Negative; no heat/cold intolerance; no diabetes Neuro: Negative; no changes in balance, headaches Skin: Negative; No rashes or skin lesions Psychiatric: Negative; No behavioral problems, depression Sleep: Negative; No snoring, daytime sleepiness, hypersomnolence, bruxism, restless legs, hypnogognic hallucinations, no cataplexy Other comprehensive 14 point system review is negative.   PE BP 140/72 (BP Location: Left Arm, Patient Position: Sitting, Cuff Size: Normal)   Pulse 75   Ht _0  (1.626 m)   Wt 183 lb (83 kg)   BMI 31.41 kg/m    By me was 130/70  Wt Readings from Last 3 Encounters:  03/20/18 183 lb (83 kg)  10/04/16 185 lb 6.4 oz (84.1 kg)  09/12/15 181 lb 3.2 oz (82.2 kg)   General: Alert, oriented, no distress.  Skin: normal turgor, no rashes, warm and dry HEENT: Normocephalic, atraumatic. Pupils equal round and reactive to light; sclera anicteric; extraocular muscles intact;  Nose without nasal septal hypertrophy Mouth/Parynx benign; Mallinpatti scale 2 Neck: No JVD, soft left carotid bruit; normal carotid upstroke Lungs: clear to ausculatation and percussion; no wheezing or  rales Chest wall: without tenderness to palpitation Heart: PMI not displaced, RRR, s1 s2 normal, 1/6 systolic murmur in the aortic area, no diastolic murmur, no rubs, gallops, thrills, or heaves Abdomen: soft, nontender; no hepatosplenomehaly, BS+; abdominal aorta nontender and not dilated by palpation. Back: no CVA tenderness Pulses 2+ Musculoskeletal: full range of motion, normal strength, no joint deformities Extremities: no clubbing cyanosis or edema, Homan's sign negative  Neurologic: grossly nonfocal; Cranial nerves grossly wnl Psychologic: Normal mood and affect   ECG (independently read by me): Normal sinus rhythm.  Left bundle branch block with repolarization changes.  PAC.  October 2017 ECG (independently read by me): Normal sinus rhythm at 60 bpm.  Left bundle-branch block with repolarization changes.  Normal intervals.  September 2000 ECG (independently read by me): Normal sinus rhythm with mild sinus arrhythmia.  Left bundle branch block with repolarization changes.  02/15/2015 ECG (independently read by me): Normal sinus rhythm at 67.  Left bundle branch block with repolarization changes.  Premature atrial complex.  March 2015 ECG (independently read by me): Normal sinus rhythm with left bundle-branch block with a ventricular rate 69 beats per minute.  LABS: BMP Latest Ref Rng & Units 03/18/2011 03/17/2011 03/08/2011  Glucose 70 - 99 mg/dL 130(H) 138(H) 104(H)  BUN 6 - 23 mg/dL _1 Creatinine 0.4 - 1.2 mg/dL 0.72 0.74 0.85  Sodium 135 - 145 mEq/L 138 138 139  Potassium 3.5 - 5.1 mEq/L 4.1 4.7 4.6  Chloride 96 - 112 mEq/L 106 105 104  CO2 19 - 32 mEq/L _2 Calcium 8.4 - 10.5 mg/dL 8.7 8.1(L) 9.6   Hepatic Function Latest Ref Rng & Units 03/08/2011  Total Protein 6.0 - 8.3 g/dL 7.5  Albumin 3.5 - 5.2 g/dL 4.0  AST 0 - 37 U/L 33  ALT 0 - 35 U/L 24  Alk Phosphatase 39 - 117 U/L 50  Total Bilirubin 0.3 - 1.2 mg/dL 0.8   CBC Latest Ref Rng & Units 03/19/2011  03/18/2011 03/17/2011  WBC 4.0 - 10.5 K/uL 11.1(H) 10.7(H) 10.1  Hemoglobin 12.0 - 15.0 g/dL 9.6(L) 10.2(L) 10.8(L)  Hematocrit 36.0 - 46.0 % 29.6(L) 31.0(L) 33.2(L)  Platelets 150 - 400 K/uL 259 247  256   Lab Results  Component Value Date   MCV 95.2 03/19/2011   MCV 95.7 03/18/2011   MCV 96.8 03/17/2011   No results found for: TSH.  Lipid Panel  No results found for: CHOL, TRIG, HDL, CHOLHDL, VLDL, LDLCALC, LDLDIRECT   RADIOLOGY: No results found.  IMPRESSION: No diagnosis found.  ASSESSMENT AND PLAN: Rhonda. Rhonda Burke is an 84 year old female who has chronic left bundle branch block as well as hypertension.  Cardiac catheterization in 2008 showed normal coronary arteries.  There was mild pulmonary hypertension.  At that time, ejection fraction was 35-40%.  Her ejection fraction subsequently improved on a follow-up echo in 2012 at 40-45% and remained stable on f/u echo from March 2016.  Pressure today remains stable on her current medical regimen consisting of lisinopril 20 mg daily and carvedilol 6.25 mg twice a day.  Mild carotid plaque noted on duplex imaging.  She continues to be on simvastatin 40 mg daily for hyperlipidemia.  Try to obtain results of her last laboratory from Dr. Sandi Mariscal.  She has tonic left bundle branch block which is stable.  BMI is mildly increased at 31.41 consistent with obesity.  He is not having any arrhythmia.  Her cardiac monitor only showed a rare PAC.  As long as she remains stable I will see her in 1 year for reevaluation.  Time spent: 25 minutes  Troy Sine, MD, [facility]  03/20/2018 4:06 PM

## 2018-03-20 NOTE — Patient Instructions (Signed)

## 2018-03-22 ENCOUNTER — Encounter: Payer: Self-pay | Admitting: Cardiovascular Disease

## 2018-09-02 ENCOUNTER — Other Ambulatory Visit: Payer: Self-pay | Admitting: Family Medicine

## 2018-09-02 DIAGNOSIS — Z1231 Encounter for screening mammogram for malignant neoplasm of breast: Secondary | ICD-10-CM

## 2018-10-09 ENCOUNTER — Ambulatory Visit
Admission: RE | Admit: 2018-10-09 | Discharge: 2018-10-09 | Disposition: A | Discharge status: Home / Self Care | Payer: Medicare Other | Source: Ambulatory Visit | Attending: Family Medicine | Admitting: Family Medicine

## 2018-10-09 DIAGNOSIS — Z1231 Encounter for screening mammogram for malignant neoplasm of breast: Secondary | ICD-10-CM

## 2019-01-23 ENCOUNTER — Other Ambulatory Visit: Payer: Self-pay | Admitting: Family Medicine

## 2019-01-23 DIAGNOSIS — N6459 Other signs and symptoms in breast: Secondary | ICD-10-CM

## 2019-02-02 ENCOUNTER — Ambulatory Visit
Admission: RE | Admit: 2019-02-02 | Discharge: 2019-02-02 | Disposition: A | Discharge status: Home / Self Care | Payer: Medicare Other | Source: Ambulatory Visit | Attending: Family Medicine | Admitting: Family Medicine

## 2019-02-02 ENCOUNTER — Other Ambulatory Visit: Payer: Medicare Other

## 2019-02-02 DIAGNOSIS — N6459 Other signs and symptoms in breast: Secondary | ICD-10-CM

## 2019-04-20 ENCOUNTER — Ambulatory Visit: Payer: Medicare Other | Admitting: Cardiovascular Disease

## 2019-09-02 ENCOUNTER — Other Ambulatory Visit: Payer: Self-pay

## 2019-09-02 ENCOUNTER — Ambulatory Visit (INDEPENDENT_AMBULATORY_CARE_PROVIDER_SITE_OTHER): Payer: Medicare Other | Admitting: Cardiovascular Disease

## 2019-09-02 ENCOUNTER — Encounter: Payer: Self-pay | Admitting: Cardiovascular Disease

## 2019-09-02 VITALS — BP 161/84 | HR 69 | Temp 96.9°F | Ht 64.0 in | Wt 165.0 lb

## 2019-09-02 DIAGNOSIS — Z79899 Other long term (current) drug therapy: Secondary | ICD-10-CM

## 2019-09-02 DIAGNOSIS — I10 Essential (primary) hypertension: Secondary | ICD-10-CM | POA: Diagnosis not present

## 2019-09-02 MED ORDER — LISINOPRIL 20 MG PO TABS: 30.0000 mg | ORAL_TABLET | Freq: Every day | ORAL | 6 refills | Status: DC

## 2019-09-02 NOTE — Progress Notes (Signed)
Patient ID: Rhonda Burke, female   DOB: [birth year], 85 y.o.   MRN: [id number]   Primary MD: Dr. Derinda Late  HPI: Rhonda Burke is a 85 y.o. female who presents to the office for a 17 month cardiology evaluation.  Rhonda Burke has a history of left bundle branch block which was discovered when she presented for a routine colonoscopy and had significant ectopy an ECG in 2008.  I saw her for cardiology evaluation and echo Doppler study showed an ejection fraction of 35% with mild pulmonary hypertension. A nuclear perfusion study raised the possibility of mild ischemia and consequently she underwent a right and left heart catheterization which showed an ejection fraction of 35-40% and mild pulmonary hypertension with a PA pressure 35/15. Her LV pressure was 135/13. She was treated with medical therapy. An echo Doppler study in February 2012 showed an ejection fraction of 40-45% and she had septal wall motion abnormality due to her conduction abnormality. There was still evidence of pulmonary hypertension with estimated RV systolic pressure 41 mm there was evidence for aortic valve sclerosis.  Additional problems include hyperlipidemia and hypertension. She has tolerated a left knee replacement surgery by Dr. Sandie Ano 2012 about cardiovascular problems. Her last stress test was done preoperatively in February 2012 which showed normal perfusion.  She is status post right nephrectomy for angiomyolipoma.  On 02/23/2015 she underwent a follow-up echo Doppler study.  This showed an ejection fraction of 40-45%.  There was grade 1 diastolic dysfunction.  There was trivial aortic insufficiency.  There was mild pulmonary hypertension with a PA pressure 35 mm.  I  saw her in October 2017.  A carotid duplex evaluation in June 2017 showed 39% bilateral proximal internal carotid artery stenoses.  A cardiac monitor in January 2018  showed sinus rhythm throughout the monitoring.  With an average heart rate at 71  bpm.  There was a rare PAC.  She has had some issues with her leg.  She is status post left knee replacement and has undergone injections into the other knee.    I last saw her in laboratory at that time revealed a creatinine of 1.06, BUN 25, potassium 4.7 which have been taking in on January 14, 2018 by Dr. Sandi Mariscal.  Over the past year and a half, she has been without any episodes of chest pain or significant shortness of breath.  Her activity level is less particularly in this COVID 19 pandemic.  Is unaware of any presyncope or syncope or episodes of tachycardia.  She is scheduled to see Dr. Sandi Mariscal in December and complete laboratory will be done at that time.  She presents for reevaluation.  Past Medical History:  Diagnosis Date  . Angiomyolipoma    right  . Aortic sclerosis   . Asthma, mild intermittent   . Bilateral hearing loss   . Bowel obstruction (HCC)    sigmoid ulceration and obstruction  . Breast thickening    right breast  . Degenerative disc disease    C-spine  . Fibrocystic breast disease   . Hypercholesteremia   . Hypertension   . LBBB (left bundle branch block)   . Mitral regurgitation   . Osteopenia   . Pulmonary hypertension ([place])   . Venous insufficiency     Past Surgical History:  Procedure Laterality Date  . APPENDECTOMY  12/2001  . BREAST EXCISIONAL BIOPSY Right 1999  . CARDIAC CATHETERIZATION  10/02/2007   moderate LV dysfunction, EF 35-40%, conc LVH, normal coronaries, mild pulm HTN (  Dr. Corky Downs)  . CATARACT EXTRACTION Bilateral 2004  . CHOLECYSTECTOMY  2003  . COLECTOMY  12/2001   left sigmoid, obstruction/diverticulitis  . COLON SURGERY     bowel obstruction and ulceration  . HERNIA REPAIR    . NEPHRECTOMY  12/2001   right, angiomyolipoma  . NM MYOCAR PERF WALL MOTION  01/2011   dipyridamole myoview; normal pattern of perfusion in all regions, EF 61%, normal, low risk scan   . TOTAL KNEE ARTHROPLASTY Left 03/16/2011   Dr Maureen Ralphs  . TRANSTHORACIC  ECHOCARDIOGRAM  01/2011   EF 40-45%, mild conc LVH; RV mildly dilated; mild mitral annular calcif, mild MR; mild TR, RSVP elevated at 40-52mHg (mild pulm htn); AV mildly sclerotic     No Known Allergies  Current Outpatient Medications  Medication Sig Dispense Refill  . carvedilol (COREG) 6.25 MG tablet Take 6.25 mg by mouth 2 (two) times daily with a meal.      . lisinopril (PRINIVIL,ZESTRIL) 20 MG tablet Take 20 mg by mouth daily.      . simvastatin (ZOCOR) 40 MG tablet Take 40 mg by mouth at bedtime.      .Marland Kitchenaspirin EC 81 MG tablet Take 81 mg by mouth daily.    . Multiple Vitamins-Minerals (MULTIVITAMIN WITH MINERALS) tablet Take 1 tablet by mouth daily.     No current facility-administered medications for this visit.     Social History   Socioeconomic History  . Marital status: Married    Spouse name: Not on file  . Number of children: 3  . Years of education: Not on file  . Highest education level: Not on file  Occupational History    Employer: RETIRED  Social Needs  . Financial resource strain: Not on file  . Food insecurity    Worry: Not on file    Inability: Not on file  . Transportation needs    Medical: Not on file    Non-medical: Not on file  Tobacco Use  . Smoking status: Former SResearch scientist (life sciences) . Smokeless tobacco: Never Used  Substance and Sexual Activity  . Alcohol use: Yes    Alcohol/week: 1.0 standard drinks    Types: 1 Glasses of wine per week    Comment: one glass daily  . Drug use: No  . Sexual activity: Yes    Birth control/protection: Post-menopausal  Lifestyle  . Physical activity    Days per week: Not on file    Minutes per session: Not on file  . Stress: Not on file  Relationships  . Social [place] phone: Not on file    Gets together: Not on file    Attends religious service: Not on file    Active member of club or organization: Not on file    Attends meetings of clubs or organizations: Not on file    Relationship status: Not on  file  . Intimate partner violence    Fear of current or ex partner: Not on file    Emotionally abused: Not on file    Physically abused: Not on file    Forced sexual activity: Not on file  Other Topics Concern  . Not on file  Social History Narrative  . Not on file   Socially she is very has 3 children, 5 grandchildren one great-grandchild. There is no tobacco or alcohol use. She does not routinely exercise.  Family History  Problem Relation Age of Onset  . Heart disease Mother  also stroke  . Heart disease Father        also stroke   . Lung cancer Sister   . Cancer Sister 73   ROS General: Negative; No fevers, chills, or night sweats;  HEENT: Negative; No changes in vision or hearing, sinus congestion, difficulty swallowing Pulmonary: Negative; No cough, wheezing, shortness of breath, hemoptysis Cardiovascular: Negative; No chest pain, presyncope, syncope, palpitations GI: Negative; No nausea, vomiting, diarrhea, or abdominal pain GU: Negative; No dysuria, hematuria, or difficulty voiding Musculoskeletal: Positive for degenerative disc disease; discomfort Hematologic/Oncology: Negative; no easy bruising, bleeding Endocrine: Negative; no heat/cold intolerance; no diabetes Neuro: Negative; no changes in balance, headaches Skin: Negative; No rashes or skin lesions Psychiatric: Negative; No behavioral problems, depression Sleep: Negative; No snoring, daytime sleepiness, hypersomnolence, bruxism, restless legs, hypnogognic hallucinations, no cataplexy Other comprehensive 14 point system review is negative.   PE BP (!) 161/84   Pulse 69   Temp (!) 96.9 F (36.1 C)   Ht _0  (1.626 m)   Wt 165 lb (74.8 kg)   SpO2 98%   BMI 28.32 kg/m    Repeat blood pressure by me was elevated at 160/80  Wt Readings from Last 3 Encounters:  09/02/19 165 lb (74.8 kg)  03/20/18 183 lb (83 kg)  10/04/16 185 lb 6.4 oz (84.1 kg)   General: Alert, oriented, no distress.  Skin:  normal turgor, no rashes, warm and dry HEENT: Normocephalic, atraumatic. Pupils equal round and reactive to light; sclera anicteric; extraocular muscles intact;  Nose without nasal septal hypertrophy Mouth/Parynx benign; Mallinpatti scale 3 Neck: No JVD, soft left carotid bruit; normal carotid upstroke Lungs: clear to ausculatation and percussion; no wheezing or rales Chest wall: without tenderness to palpitation Heart: PMI not displaced, RRR, s1 s2 normal, 1/6 systolic murmur, no diastolic murmur, no rubs, gallops, thrills, or heaves Abdomen: soft, nontender; no hepatosplenomehaly, BS+; abdominal aorta nontender and not dilated by palpation. Back: no CVA tenderness Pulses 2+ Musculoskeletal: full range of motion, normal strength, no joint deformities Extremities: no clubbing cyanosis or edema, Homan's sign negative  Neurologic: grossly nonfocal; Cranial nerves grossly wnl Psychologic: Normal mood and affect   ECG (independently read by me): Sinus rhythm at 73 with PAC; left bundle branch block  April 2019 ECG (independently read by me): Normal sinus rhythm.  Left bundle branch block with repolarization changes.  PAC.  October 2017 ECG (independently read by me): Normal sinus rhythm at 60 bpm.  Left bundle-branch block with repolarization changes.  Normal intervals.  September 2000 ECG (independently read by me): Normal sinus rhythm with mild sinus arrhythmia.  Left bundle branch block with repolarization changes.  02/15/2015 ECG (independently read by me): Normal sinus rhythm at 67.  Left bundle branch block with repolarization changes.  Premature atrial complex.  March 2015 ECG (independently read by me): Normal sinus rhythm with left bundle-branch block with a ventricular rate 69 beats per minute.  LABS: BMP Latest Ref Rng & Units 03/18/2011 03/17/2011 03/08/2011  Glucose 70 - 99 mg/dL 130(H) 138(H) 104(H)  BUN 6 - 23 mg/dL _1 Creatinine 0.4 - 1.2 mg/dL 0.72 0.74 0.85  Sodium  135 - 145 mEq/L 138 138 139  Potassium 3.5 - 5.1 mEq/L 4.1 4.7 4.6  Chloride 96 - 112 mEq/L 106 105 104  CO2 19 - 32 mEq/L _2 Calcium 8.4 - 10.5 mg/dL 8.7 8.1(L) 9.6   Hepatic Function Latest Ref Rng & Units 03/08/2011  Total Protein 6.0 -  8.3 g/dL 7.5  Albumin 3.5 - 5.2 g/dL 4.0  AST 0 - 37 U/L 33  ALT 0 - 35 U/L 24  Alk Phosphatase 39 - 117 U/L 50  Total Bilirubin 0.3 - 1.2 mg/dL 0.8   CBC Latest Ref Rng & Units 03/19/2011 03/18/2011 03/17/2011  WBC 4.0 - 10.5 K/uL 11.1(H) 10.7(H) 10.1  Hemoglobin 12.0 - 15.0 g/dL 9.6(L) 10.2(L) 10.8(L)  Hematocrit 36.0 - 46.0 % 29.6(L) 31.0(L) 33.2(L)  Platelets 150 - 400 K/uL 259 247 256   Lab Results  Component Value Date   MCV 95.2 03/19/2011   MCV 95.7 03/18/2011   MCV 96.8 03/17/2011   No results found for: TSH.  Lipid Panel  No results found for: CHOL, TRIG, HDL, CHOLHDL, VLDL, LDLCALC, LDLDIRECT   RADIOLOGY: No results found.  IMPRESSION: No diagnosis found.  ASSESSMENT AND PLAN: Rhonda. Rhonda Burke is a 85 year old female who has chronic left bundle branch block as well as hypertension.  Cardiac catheterization in 2008 showed normal coronary arteries.  She has a history of  mild pulmonary hypertension.  At that time, ejection fraction was 35-40%.  Her ejection fraction subsequently improved on a follow-up echo in 2012 at 40-45% and remained stable on f/u echo from March 2016.  Her blood pressure today is elevated taking carvedilol 6.25 mg twice a day and lisinopril 20 mg.  Gust with her target blood pressure less than 130/80 with ideal blood pressure less than 120/80.  I am recommending titration of lisinopril to 30 mg daily plan to obtain a follow-up chemistry in 2 weeks to make certain she is tolerating this from a renal and electrolyte standpoint..  She is not having any chest pain symptomatology.  Does sodium restriction and increased activity as possible.  She will be seeing Dr. Sandi Mariscal for complete physical in December at  which time he will obtain his comprehensive laboratory assessment.  Pressure remains elevated, further titration of lisinopril to 40 mg can be done alternatively she can be started on low-dose amlodipine for additional treatment.  I will see her in 6 months for reevaluation.  Time spent: 25 min Troy Sine, MD, [facility]  09/02/2019 10:41 AM

## 2019-09-02 NOTE — Patient Instructions (Addendum)
Medication Instructions:  INCREASE LISINOPRIL 30MG  (1-1/2 TAB) DAILY If you need a refill on your cardiac medications before your next appointment, please call your pharmacy.  Labwork: CMET IN 2 WEEKS HERE IN OUR OFFICE AT [facility]    You will NOT need to fast   Take the provided lab slips with you to the lab for your blood draw.   When you have your labs (blood work) drawn today and your tests are completely normal, you will receive your results only by MyChart Message (if you have MyChart) -OR-  A paper copy in the mail.  If you have any lab test that is abnormal or we need to change your treatment, we will call you to review these results.  Follow-Up: You will need a follow up appointment in 6 months.  Please call our office 3 months in advance, December 2020 to schedule this, MARCH 2021 appointment.  You may see Shelva Majestic, MD or one of the following Advanced Practice Providers on your designated Care Team: [place], [place] . Fabian Sharp, PA-C     At [facility], you and your health needs are our priority.  As part of our continuing mission to provide you with exceptional heart care, we have created designated Provider Care Teams.  These Care Teams include your primary Cardiologist (physician) and Advanced Practice Providers (APPs -  Physician Assistants and Nurse Practitioners) who all work together to provide you with the care you need, when you need it.  Thank you for choosing [facility] at [facility]!!

## 2019-09-08 ENCOUNTER — Other Ambulatory Visit: Payer: Self-pay | Admitting: Family Medicine

## 2019-09-08 DIAGNOSIS — Z1231 Encounter for screening mammogram for malignant neoplasm of breast: Secondary | ICD-10-CM

## 2019-09-15 ENCOUNTER — Encounter: Payer: Self-pay | Admitting: Cardiovascular Disease

## 2019-09-16 LAB — COMPREHENSIVE METABOLIC PANEL
ALT: 13 IU/L (ref 0–32)
AST: 24 IU/L (ref 0–40)
Albumin/Globulin Ratio: 1.9 (ref 1.2–2.2)
Albumin: 4.3 g/dL (ref 3.5–4.6)
Alkaline Phosphatase: 63 IU/L (ref 39–117)
BUN/Creatinine Ratio: 23 (ref 12–28)
BUN: 25 mg/dL (ref 10–36)
Bilirubin Total: 0.4 mg/dL (ref 0.0–1.2)
CO2: 22 mmol/L (ref 20–29)
Calcium: 9.8 mg/dL (ref 8.7–10.3)
Chloride: 106 mmol/L (ref 96–106)
Creatinine, Ser: 1.11 mg/dL — ABNORMAL HIGH (ref 0.57–1.00)
GFR calc Af Amer: 51 mL/min/{1.73_m2} — ABNORMAL LOW (ref >59)
GFR calc non Af Amer: 44 mL/min/{1.73_m2} — ABNORMAL LOW (ref >59)
Globulin, Total: 2.3 g/dL (ref 1.5–4.5)
Glucose: 96 mg/dL (ref 65–99)
Potassium: 5.3 mmol/L — ABNORMAL HIGH (ref 3.5–5.2)
Sodium: 142 mmol/L (ref 134–144)
Total Protein: 6.6 g/dL (ref 6.0–8.5)

## 2019-09-21 ENCOUNTER — Other Ambulatory Visit: Payer: Self-pay

## 2019-09-21 DIAGNOSIS — Z79899 Other long term (current) drug therapy: Secondary | ICD-10-CM

## 2019-09-21 DIAGNOSIS — I10 Essential (primary) hypertension: Secondary | ICD-10-CM

## 2019-10-21 ENCOUNTER — Other Ambulatory Visit: Payer: Self-pay

## 2019-10-21 ENCOUNTER — Ambulatory Visit
Admission: RE | Admit: 2019-10-21 | Discharge: 2019-10-21 | Disposition: A | Discharge status: Home / Self Care | Payer: Medicare Other | Source: Ambulatory Visit | Attending: Family Medicine | Admitting: Family Medicine

## 2019-10-21 DIAGNOSIS — Z1231 Encounter for screening mammogram for malignant neoplasm of breast: Secondary | ICD-10-CM

## 2019-11-24 ENCOUNTER — Ambulatory Visit (HOSPITAL_COMMUNITY)
Admission: RE | Admit: 2019-11-24 | Discharge: 2019-11-24 | Disposition: A | Discharge status: Home / Self Care | Payer: Medicare Other | Source: Ambulatory Visit | Attending: Family | Admitting: Family

## 2019-11-24 ENCOUNTER — Other Ambulatory Visit: Payer: Self-pay

## 2019-11-24 ENCOUNTER — Other Ambulatory Visit (HOSPITAL_COMMUNITY): Payer: Self-pay | Admitting: Family Medicine

## 2019-11-24 DIAGNOSIS — R0989 Other specified symptoms and signs involving the circulatory and respiratory systems: Secondary | ICD-10-CM

## 2020-03-01 ENCOUNTER — Ambulatory Visit: Payer: Medicare Other | Admitting: Cardiovascular Disease

## 2020-03-25 ENCOUNTER — Telehealth: Payer: Self-pay | Admitting: Cardiovascular Disease

## 2020-03-25 NOTE — Telephone Encounter (Signed)
Spoke with patient's daughter, they would like to hold off on making appointment. Rhonda Burke has just lost her husband.Marland Kitchen AF

## 2020-08-11 ENCOUNTER — Other Ambulatory Visit: Payer: Self-pay

## 2020-08-11 ENCOUNTER — Ambulatory Visit (INDEPENDENT_AMBULATORY_CARE_PROVIDER_SITE_OTHER): Payer: Medicare Other | Admitting: Cardiovascular Disease

## 2020-08-11 ENCOUNTER — Encounter: Payer: Self-pay | Admitting: Cardiovascular Disease

## 2020-08-11 DIAGNOSIS — I447 Left bundle-branch block, unspecified: Secondary | ICD-10-CM | POA: Diagnosis not present

## 2020-08-11 DIAGNOSIS — I428 Other cardiomyopathies: Secondary | ICD-10-CM

## 2020-08-11 DIAGNOSIS — E7800 Pure hypercholesterolemia, unspecified: Secondary | ICD-10-CM | POA: Diagnosis not present

## 2020-08-11 DIAGNOSIS — I10 Essential (primary) hypertension: Secondary | ICD-10-CM

## 2020-08-11 NOTE — Patient Instructions (Signed)
Medication Instructions:  CONTINUE WITH CURRENT MEDICATIONS. NO CHANGES.  *If you need a refill on your cardiac medications before your next appointment, please call your pharmacy*      Follow-Up: At [facility], you and your health needs are our priority.  As part of our continuing mission to provide you with exceptional heart care, we have created designated Provider Care Teams.  These Care Teams include your primary Cardiologist (physician) and Advanced Practice Providers (APPs -  Physician Assistants and Nurse Practitioners) who all work together to provide you with the care you need, when you need it.  We recommend signing up for the patient portal called "MyChart".  Sign up information is provided on this After Visit Summary.  MyChart is used to connect with patients for Virtual Visits (Telemedicine).  Patients are able to view lab/test results, encounter notes, upcoming appointments, etc.  Non-urgent messages can be sent to your provider as well.   To learn more about what you can do with MyChart, go to [email or web address].    Your next appointment:   12 month(s)  The format for your next appointment:   In Person  Provider:   [facility]

## 2020-08-11 NOTE — Progress Notes (Signed)
Patient ID: Rhonda Burke, female   DOB: February 12, 1927, 86 y.o.   MRN: [id number]   Primary MD: Dr. Derinda Late  HPI: Rhonda Burke is a 86 y.o. female who presents to the office for an 2 month cardiology evaluation.  Rhonda Burke has a history of left bundle branch block which was discovered when she presented for a routine colonoscopy and had significant ectopy an ECG in 2008.  I saw her for cardiology evaluation and echo Doppler study showed an ejection fraction of 35% with mild pulmonary hypertension. A nuclear perfusion study raised the possibility of mild ischemia and consequently she underwent a right and left heart catheterization which showed an ejection fraction of 35-40% and mild pulmonary hypertension with a PA pressure 35/15. Her LV pressure was 135/13. She was treated with medical therapy. An echo Doppler study in February 2012 showed an ejection fraction of 40-45% and she had septal wall motion abnormality due to her conduction abnormality. There was still evidence of pulmonary hypertension with estimated RV systolic pressure 41 mm there was evidence for aortic valve sclerosis.  Additional problems include hyperlipidemia and hypertension. She has tolerated a left knee replacement surgery by Dr. Sandie Ano 2012 about cardiovascular problems. Her last stress test was done preoperatively in February 2012 which showed normal perfusion.  She is status post right nephrectomy for angiomyolipoma.  On 02/23/2015 she underwent a follow-up echo Doppler study.  This showed an ejection fraction of 40-45%.  There was grade 1 diastolic dysfunction.  There was trivial aortic insufficiency.  There was mild pulmonary hypertension with a PA pressure 35 mm.  I saw her in October 2017.  A carotid duplex evaluation in June 2017 showed 39% bilateral proximal internal carotid artery stenoses.  A cardiac monitor in January 2018  showed sinus rhythm throughout the monitoring.  With an average  heart rate at 71 bpm.  There was a rare PAC.  She has had some issues with her leg.  She is status post left knee replacement and has undergone injections into the other knee.    Laboratory obtained on January 14, 2018 by Dr. Sandi Mariscal revealed a creatinine of 1.06, BUN 25, potassium 4.7 which have been taking in   I last saw her in September 2020.  Over the past year and a half, she has been without any episodes of chest pain or significant shortness of breath.  Her activity level is less particularly in this COVID 19 pandemic.  She was unaware of any presyncope or syncope or episodes of tachycardia.  She saw Dr. Sandi Mariscal in December 2020.  Since I last saw her, unfortunately her husband passed away on Easter Sunday 2021.  She has remained stable without chest pain or shortness of breath.  She denies palpitations.  She is unaware of leg swelling.  She presents for evaluation.  Past Medical History:  Diagnosis Date  . Angiomyolipoma    right  . Aortic sclerosis   . Asthma, mild intermittent   . Bilateral hearing loss   . Bowel obstruction (HCC)    sigmoid ulceration and obstruction  . Breast thickening    right breast  . Degenerative disc disease    C-spine  . Fibrocystic breast disease   . Hypercholesteremia   . Hypertension   . LBBB (left bundle branch block)   . Mitral regurgitation   . Osteopenia   . Pulmonary hypertension ([place])   . Venous insufficiency     Past Surgical History:  Procedure Laterality Date  .  APPENDECTOMY  12/2001  . BREAST EXCISIONAL BIOPSY Right 1999  . CARDIAC CATHETERIZATION  10/02/2007   moderate LV dysfunction, EF 35-40%, conc LVH, normal coronaries, mild pulm HTN (Dr. Corky Downs)  . CATARACT EXTRACTION Bilateral 2004  . CHOLECYSTECTOMY  2003  . COLECTOMY  12/2001   left sigmoid, obstruction/diverticulitis  . COLON SURGERY     bowel obstruction and ulceration  . HERNIA REPAIR    . NEPHRECTOMY  12/2001   right, angiomyolipoma  . NM MYOCAR PERF WALL  MOTION  01/2011   dipyridamole myoview; normal pattern of perfusion in all regions, EF 61%, normal, low risk scan   . TOTAL KNEE ARTHROPLASTY Left 03/16/2011   Dr Maureen Ralphs  . TRANSTHORACIC ECHOCARDIOGRAM  01/2011   EF 40-45%, mild conc LVH; RV mildly dilated; mild mitral annular calcif, mild MR; mild TR, RSVP elevated at 40-68mHg (mild pulm htn); AV mildly sclerotic     No Known Allergies  Current Outpatient Medications  Medication Sig Dispense Refill  . amoxicillin (AMOXIL) 500 MG capsule Take 1,000 mg by mouth 2 (two) times daily.    . calcium carbonate (OSCAL) 1500 (600 Ca) MG TABS tablet Take 1,500 mg by mouth 2 (two) times daily with a meal. Take Twice Daily with Food    . carvedilol (COREG) 6.25 MG tablet Take 6.25 mg by mouth 2 (two) times daily with a meal.      . lisinopril (ZESTRIL) 20 MG tablet Take 1.5 tablets (30 mg total) by mouth daily. 45 tablet 6  . simvastatin (ZOCOR) 40 MG tablet Take 40 mg by mouth at bedtime.       No current facility-administered medications for this visit.    Social History   Socioeconomic History  . Marital status: Married    Spouse name: Not on file  . Number of children: 3  . Years of education: Not on file  . Highest education level: Not on file  Occupational History    Employer: RETIRED  Tobacco Use  . Smoking status: Former SResearch scientist (life sciences) . Smokeless tobacco: Never Used  Substance and Sexual Activity  . Alcohol use: Yes    Alcohol/week: 1.0 standard drink    Types: 1 Glasses of wine per week    Comment: one glass daily  . Drug use: No  . Sexual activity: Yes    Birth control/protection: Post-menopausal  Other Topics Concern  . Not on file  Social History Narrative  . Not on file   Social Determinants of Health   Financial Resource Strain:   . Difficulty of Paying Living Expenses: Not on file  Food Insecurity:   . Worried About RCharity fundraiserin the Last Year: Not on file  . Ran Out of Food in the Last Year: Not on file   Transportation Needs:   . Lack of Transportation (Medical): Not on file  . Lack of Transportation (Non-Medical): Not on file  Physical Activity:   . Days of Exercise per Week: Not on file  . Minutes of Exercise per Session: Not on file  Stress:   . Feeling of Stress : Not on file  Social Connections:   . Frequency of Communication with Friends and Family: Not on file  . Frequency of Social Gatherings with Friends and Family: Not on file  . Attends Religious Services: Not on file  . Active Member of Clubs or Organizations: Not on file  . Attends CArchivistMeetings: Not on file  . Marital Status: Not on file  Intimate Partner Violence:   . Fear of Current or Ex-Partner: Not on file  . Emotionally Abused: Not on file  . Physically Abused: Not on file  . Sexually Abused: Not on file   Socially she is very has 3 children, 5 grandchildren one great-grandchild. There is no tobacco or alcohol use. She does not routinely exercise.  Family History  Problem Relation Age of Onset  . Heart disease Mother        also stroke  . Heart disease Father        also stroke   . Lung cancer Sister   . Cancer Sister 36   ROS General: Negative; No fevers, chills, or night sweats;  HEENT: Negative; No changes in vision or hearing, sinus congestion, difficulty swallowing Pulmonary: Negative; No cough, wheezing, shortness of breath, hemoptysis Cardiovascular: Negative; No chest pain, presyncope, syncope, palpitations GI: Negative; No nausea, vomiting, diarrhea, or abdominal pain GU: Negative; No dysuria, hematuria, or difficulty voiding Musculoskeletal: Positive for degenerative disc disease; discomfort Hematologic/Oncology: Negative; no easy bruising, bleeding Endocrine: Negative; no heat/cold intolerance; no diabetes Neuro: Negative; no changes in balance, headaches Skin: Negative; No rashes or skin lesions Psychiatric: Negative; No behavioral problems, depression Sleep: Negative; No  snoring, daytime sleepiness, hypersomnolence, bruxism, restless legs, hypnogognic hallucinations, no cataplexy Other comprehensive 14 point system review is negative.   PE BP 138/80   Pulse 67   Temp (!) 97 F (36.1 C)   Ht _0  (1.626 m)   Wt 166 lb (75.3 kg)   SpO2 96%   BMI 28.49 kg/m    Repeat blood pressure by me was 136/78  Wt Readings from Last 3 Encounters:  08/11/20 166 lb (75.3 kg)  09/02/19 165 lb (74.8 kg)  03/20/18 183 lb (83 kg)   General: Alert, oriented, no distress.  Skin: normal turgor, no rashes, warm and dry HEENT: Normocephalic, atraumatic. Pupils equal round and reactive to light; sclera anicteric; extraocular muscles intact;  Nose without nasal septal hypertrophy Mouth/Parynx benign; Mallinpatti scale 2 Neck: No JVD, no carotid bruits; normal carotid upstroke Lungs: clear to ausculatation and percussion; no wheezing or rales Chest wall: without tenderness to palpitation Heart: PMI not displaced, RRR, s1 s2 normal, 1/6 systolic murmur, no diastolic murmur, no rubs, gallops, thrills, or heaves Abdomen: soft, nontender; no hepatosplenomehaly, BS+; abdominal aorta nontender and not dilated by palpation. Back: no CVA tenderness Pulses 2+ Musculoskeletal: Osteoarthritic changes of her hands bilaterally with Heberden's nodes,  normal strength, Extremities: no clubbing cyanosis or edema, Homan's sign negative  Neurologic: grossly nonfocal; Cranial nerves grossly wnl Psychologic: Normal mood and affect   ECG (independently read by me): Normal sinus rhythm at 67 bpm, PAC, left bundle branch block  September 2020 ECG (independently read by me): Sinus rhythm at 73 with PAC; left bundle branch block  April 2019 ECG (independently read by me): Normal sinus rhythm.  Left bundle branch block with repolarization changes.  PAC.  October 2017 ECG (independently read by me): Normal sinus rhythm at 60 bpm.  Left bundle-branch block with repolarization changes.  Normal  intervals.  September 2000 ECG (independently read by me): Normal sinus rhythm with mild sinus arrhythmia.  Left bundle branch block with repolarization changes.  02/15/2015 ECG (independently read by me): Normal sinus rhythm at 67.  Left bundle branch block with repolarization changes.  Premature atrial complex.  March 2015 ECG (independently read by me): Normal sinus rhythm with left bundle-branch block with a ventricular rate 69 beats per minute.  LABS: BMP Latest Ref Rng & Units 09/16/2019 03/18/2011 03/17/2011  Glucose 65 - 99 mg/dL 96 130(H) 138(H)  BUN 10 - 36 mg/dL _0 Creatinine 0.57 - 1.00 mg/dL 1.11(H) 0.72 0.74  BUN/Creat Ratio 12 - 28 23 - -  Sodium 134 - 144 mmol/L 142 138 138  Potassium 3.5 - 5.2 mmol/L 5.3(H) 4.1 4.7  Chloride 96 - 106 mmol/L 106 106 105  CO2 20 - 29 mmol/L _1 Calcium 8.7 - 10.3 mg/dL 9.8 8.7 8.1(L)   Hepatic Function Latest Ref Rng & Units 09/16/2019 03/08/2011  Total Protein 6.0 - 8.5 g/dL 6.6 7.5  Albumin 3.5 - 4.6 g/dL 4.3 4.0  AST 0 - 40 IU/L 24 33  ALT 0 - 32 IU/L 13 24  Alk Phosphatase 39 - 117 IU/L 63 50  Total Bilirubin 0.0 - 1.2 mg/dL 0.4 0.8   CBC Latest Ref Rng & Units 03/19/2011 03/18/2011 03/17/2011  WBC 4.0 - 10.5 K/uL 11.1(H) 10.7(H) 10.1  Hemoglobin 12.0 - 15.0 g/dL 9.6(L) 10.2(L) 10.8(L)  Hematocrit 36 - 46 % 29.6(L) 31.0(L) 33.2(L)  Platelets 150 - 400 K/uL 259 247 256   Lab Results  Component Value Date   MCV 95.2 03/19/2011   MCV 95.7 03/18/2011   MCV 96.8 03/17/2011   No results found for: TSH.  Lipid Panel  No results found for: CHOL, TRIG, HDL, CHOLHDL, VLDL, LDLCALC, LDLDIRECT   RADIOLOGY: No results found.  IMPRESSION: 1. Nonischemic cardiomyopathy ([place])   2. Essential hypertension   3. Left bundle branch block   4. Pure hypercholesterolemia     ASSESSMENT AND PLAN: Rhonda Burke is a 86 year old female who has chronic left bundle branch block as well as hypertension.  Cardiac catheterization  in 2008 showed normal coronary arteries.  She has a history of  mild pulmonary hypertension.  At that time, ejection fraction was 35-40%.  Her ejection fraction subsequently improved on a follow-up echo in 2012 at 40-45% and remained stable on f/u echo from March 2016.  When I last saw her a year ago blood pressure was elevated and further titration of medication was recommended.  Her blood pressure today on repeat by me was 136/78.  She continues to be on lisinopril at 30 mg in addition to carvedilol 6.25 mg twice a day.  ECG remained stable with sinus rhythm in the 60s with an isolated PAC.  Unfortunately her husband passed away in the morning of Easter Sunday 2021.  She is doing remarkably well.  She is euvolemic on exam.  Dr. Sandi Mariscal checks her laboratory.  She will monitor her blood pressure.  As long as she remains stable I will see her in 1 year for reevaluation or sooner as needed.   Troy Sine, MD, [facility]  08/13/2020 4:19 PM

## 2020-08-13 ENCOUNTER — Encounter: Payer: Self-pay | Admitting: Cardiovascular Disease

## 2020-09-20 ENCOUNTER — Other Ambulatory Visit: Payer: Self-pay | Admitting: Family Medicine

## 2020-09-20 DIAGNOSIS — Z1231 Encounter for screening mammogram for malignant neoplasm of breast: Secondary | ICD-10-CM

## 2020-10-24 ENCOUNTER — Other Ambulatory Visit: Payer: Self-pay

## 2020-10-24 ENCOUNTER — Ambulatory Visit
Admission: RE | Admit: 2020-10-24 | Discharge: 2020-10-24 | Disposition: A | Discharge status: Home / Self Care | Payer: Medicare Other | Source: Ambulatory Visit | Attending: Family Medicine | Admitting: Family Medicine

## 2020-10-24 DIAGNOSIS — Z1231 Encounter for screening mammogram for malignant neoplasm of breast: Secondary | ICD-10-CM

## 2021-02-27 ENCOUNTER — Other Ambulatory Visit: Payer: Self-pay | Admitting: Family Medicine

## 2021-02-27 DIAGNOSIS — M8580 Other specified disorders of bone density and structure, unspecified site: Secondary | ICD-10-CM

## 2021-08-07 ENCOUNTER — Ambulatory Visit
Admission: RE | Admit: 2021-08-07 | Discharge: 2021-08-07 | Disposition: A | Discharge status: Home / Self Care | Payer: Medicare Other | Source: Ambulatory Visit | Attending: Family Medicine | Admitting: Family Medicine

## 2021-08-07 ENCOUNTER — Other Ambulatory Visit: Payer: Self-pay

## 2021-08-07 DIAGNOSIS — M8580 Other specified disorders of bone density and structure, unspecified site: Secondary | ICD-10-CM

## 2021-08-12 NOTE — Progress Notes (Signed)
Cardiology Office Note   Date:  08/14/2021   ID:  Rhonda Burke, Rhonda Burke [birth year], MRN [id number]  PCP:  Derinda Late, MD  Cardiologist:  Shelva Majestic, MD EP: None  Chief Complaint  Patient presents with   Follow-up     CHF       History of Present Illness: Rhonda Burke is a 87 y.o. female chronic combined CHF, HTN, carotid artery disease, pulmHTN, chronic LBBB, and who presents for routine follow-up.  She was last evaluated by cardiology at an outpatient visit with Dr. Claiborne Billings 07/2020, at which time she was doing okay from a cardiac standpoint but unfortunately her husband ad passed away on Easter Sunday 2021. She was felt to be doing remarkably well and no medication changes occurred. She was recommended to follow-up in 1 year.   Her cardiac history dates back to 2008 when she was found to have a LBBB after presenting for a routine colonoscopy. She had an echocardiogram which revealed EF 35% an dmild pulmTN. She underwent a NST which showed possible mild ischemia and ultimately had aR/LHC which showed EF 35-40%, mild pulm HTN, and normal coronary arteries. Her last echocardiogram in 2016 showed EF 40-5%, G1DD, and trivial AI. She had carotid dopplers in 2020 which showed mild bilateral carotid artery stenosis.   She presents today and continues to be doing quite well from a cardiac standpoint. She has been traveling this past year with her children who live in [place], [place], and [place] - recently returned from a big family trip to the beach. She continues to do all her ADLs and household chores without trouble. She notes some SOB when taking the trash cans to the curb but this is unchanged from previous. She has no complaints of chest pain, SOB at rest, orthopnea, PND, LE edema, dizziness, lightheadedness, syncope, or palpitations. She is anticipating the birth of her 7th great grandchild any day now.    Past Medical History:  Diagnosis Date    Angiomyolipoma    right   Aortic sclerosis    Asthma, mild intermittent    Bilateral hearing loss    Bowel obstruction (HCC)    sigmoid ulceration and obstruction   Breast thickening    right breast   Degenerative disc disease    C-spine   Fibrocystic breast disease    Hypercholesteremia    Hypertension    LBBB (left bundle branch block)    Mitral regurgitation    Osteopenia    Pulmonary hypertension (HCC)    Venous insufficiency     Past Surgical History:  Procedure Laterality Date   APPENDECTOMY  12/2001   BREAST EXCISIONAL BIOPSY Right 1999   CARDIAC CATHETERIZATION  10/02/2007   moderate LV dysfunction, EF 35-40%, conc LVH, normal coronaries, mild pulm HTN (Dr. Corky Downs)   CATARACT EXTRACTION Bilateral 2004   CHOLECYSTECTOMY  2003   COLECTOMY  12/2001   left sigmoid, obstruction/diverticulitis   COLON SURGERY     bowel obstruction and ulceration   HERNIA REPAIR     NEPHRECTOMY  12/2001   right, angiomyolipoma   NM MYOCAR PERF WALL MOTION  01/2011   dipyridamole myoview; normal pattern of perfusion in all regions, EF 61%, normal, low risk scan    TOTAL KNEE ARTHROPLASTY Left 03/16/2011   Dr Maureen Ralphs   TRANSTHORACIC ECHOCARDIOGRAM  01/2011   EF 40-45%, mild conc LVH; RV mildly dilated; mild mitral annular calcif, mild MR; mild TR, RSVP elevated at 40-65mHg (mild pulm htn); AV  mildly sclerotic      Current Outpatient Medications  Medication Sig Dispense Refill   amoxicillin (AMOXIL) 500 MG capsule Take 1,000 mg by mouth 2 (two) times daily.     calcium carbonate (OSCAL) 1500 (600 Ca) MG TABS tablet Take 1,500 mg by mouth 2 (two) times daily with a meal. Take Twice Daily with Food     carvedilol (COREG) 6.25 MG tablet Take 6.25 mg by mouth 2 (two) times daily with a meal.       lisinopril (ZESTRIL) 20 MG tablet Take 1.5 tablets (30 mg total) by mouth daily. 45 tablet 6   simvastatin (ZOCOR) 40 MG tablet Take 40 mg by mouth at bedtime.       No current  facility-administered medications for this visit.    Allergies:   Patient has no known allergies.    Social History:  The patient  reports that she has quit smoking. She has never used smokeless tobacco. She reports current alcohol use of about 1.0 standard drink per week. She reports that she does not use drugs.   Family History:  The patient's family history includes Cancer (age of onset: 74) in her sister; Heart disease in her father and mother; Lung cancer in her sister.    ROS:  Please see the history of present illness.   Otherwise, review of systems are positive for none.   All other systems are reviewed and negative.    PHYSICAL EXAM: VS:  BP 140/78 (BP Location: Left Arm)   Pulse 71   Ht '5\' 4"'$  (1.626 m)   Wt 168 lb 3.2 oz (76.3 kg)   SpO2 92%   BMI 28.87 kg/m  , BMI Body mass index is 28.87 kg/m. GEN: Well nourished, well developed, in no acute distress HEENT: sclera anicteric Neck: no JVD, carotid bruits, or masses Cardiac: RRR; no murmurs, rubs, or gallops,no edema  Respiratory:  clear to auscultation bilaterally, normal work of breathing GI: soft, nontender, nondistended, + BS MS: no deformity or atrophy Skin: warm and dry, no rash Neuro:  Strength and sensation are intact Psych: euthymic mood, full affect   EKG:  EKG is ordered today. The ekg ordered today demonstrates sinus rhythm with PACs, reate 71 bpm, chronic LBBB, no significant change from previous.   Recent Labs: No results found for requested labs within last 8760 hours.    Lipid Panel No results found for: CHOL, TRIG, HDL, CHOLHDL, VLDL, LDLCALC, LDLDIRECT    Wt Readings from Last 3 Encounters:  08/14/21 168 lb 3.2 oz (76.3 kg)  08/11/20 166 lb (75.3 kg)  09/02/19 165 lb (74.8 kg)      Other studies Reviewed: Additional studies/ records that were reviewed today include:   Echocardiogram 2016: - Left ventricle: The cavity size was normal. Wall thickness was    normal. Systolic function  was mildly to moderately reduced. The    estimated ejection fraction was in the range of 40% to 45%.    Doppler parameters are consistent with abnormal left ventricular    relaxation (grade 1 diastolic dysfunction).  - Aortic valve: There was trivial regurgitation.  - Pulmonary arteries: Systolic pressure was mildly increased. PA    peak pressure: 35 mm Hg (S).   R/LHC 2008: Reviewed in scanned documents  ASSESSMENT AND PLAN:  1. Chronic combined CHF: no volume overload complaints and she appears euvolemic on exam - Continue carvedilol and lisinopril   2. HTN: BP 140/78 today - Continue carvedilol and lisinopril  3.  PulmHTN: stable  4. Carotid artery disease: mild disease on last dopplers from 2020. No symptoms to suggestion progression - Will update dopplers 11/2021 - Continue statin  5. HLD: No recent lipids on file. - Continue simvastatin   Current medicines are reviewed at length with the patient today.  The patient does not have concerns regarding medicines.  The following changes have been made:  no change  Labs/ tests ordered today include:   Orders Placed This Encounter  Procedures   EKG 12-Lead   VAS US CAROTID     Disposition:   FU with Dr. Claiborne Billings in 1 year or sooner if needed.  Signed, Abigail Butts, PA-C  08/14/2021 11:52 AM

## 2021-08-14 ENCOUNTER — Ambulatory Visit (INDEPENDENT_AMBULATORY_CARE_PROVIDER_SITE_OTHER): Payer: Medicare Other | Admitting: Medical

## 2021-08-14 ENCOUNTER — Other Ambulatory Visit: Payer: Self-pay

## 2021-08-14 ENCOUNTER — Encounter: Payer: Self-pay | Admitting: Medical

## 2021-08-14 VITALS — BP 140/78 | HR 71 | Ht 64.0 in | Wt 168.2 lb

## 2021-08-14 DIAGNOSIS — I428 Other cardiomyopathies: Secondary | ICD-10-CM

## 2021-08-14 DIAGNOSIS — I10 Essential (primary) hypertension: Secondary | ICD-10-CM

## 2021-08-14 DIAGNOSIS — I5042 Chronic combined systolic (congestive) and diastolic (congestive) heart failure: Secondary | ICD-10-CM | POA: Diagnosis not present

## 2021-08-14 DIAGNOSIS — E7800 Pure hypercholesterolemia, unspecified: Secondary | ICD-10-CM | POA: Diagnosis not present

## 2021-08-14 DIAGNOSIS — I6523 Occlusion and stenosis of bilateral carotid arteries: Secondary | ICD-10-CM

## 2021-08-14 NOTE — Patient Instructions (Signed)
Medication Instructions:  No Changes *If you need a refill on your cardiac medications before your next appointment, please call your pharmacy*   Lab Work: No Labs If you have labs (blood work) drawn today and your tests are completely normal, you will receive your results only by: [place] (if you have MyChart) OR A paper copy in the mail If you have any lab test that is abnormal or we need to change your treatment, we will call you to review the results.   Testing/Procedures: [place], [address]. (To Be Done In December) Your physician has requested that you have a carotid duplex. This test is an ultrasound of the carotid arteries in your neck. It looks at blood flow through these arteries that supply the brain with blood. Allow one hour for this exam. There are no restrictions or special instructions.    Follow-Up: At [facility], you and your health needs are our priority.  As part of our continuing mission to provide you with exceptional heart care, we have created designated Provider Care Teams.  These Care Teams include your primary Cardiologist (physician) and Advanced Practice Providers (APPs -  Physician Assistants and Nurse Practitioners) who all work together to provide you with the care you need, when you need it.  We recommend signing up for the patient portal called "MyChart".  Sign up information is provided on this After Visit Summary.  MyChart is used to connect with patients for Virtual Visits (Telemedicine).  Patients are able to view lab/test results, encounter notes, upcoming appointments, etc.  Non-urgent messages can be sent to your provider as well.   To learn more about what you can do with MyChart, go to [email or web address].    Your next appointment:   1 year(s)  The format for your next appointment:   In Person  Provider:   Shelva Majestic, MD

## 2021-10-16 DIAGNOSIS — H9190 Unspecified hearing loss, unspecified ear: Secondary | ICD-10-CM | POA: Insufficient documentation

## 2021-11-24 ENCOUNTER — Telehealth: Payer: Self-pay | Admitting: Cardiovascular Disease

## 2021-11-24 NOTE — Telephone Encounter (Signed)
Pts daughter reaching out to see if her mom is able to come to procedure alone and to get additional information about why her mom is having this procedure and what is going to be done... please advise

## 2021-11-24 NOTE — Telephone Encounter (Signed)
Called daughter [facility]) went over details of Carotid Ultrasound. Verbalized understanding. No further questions.

## 2021-11-27 ENCOUNTER — Ambulatory Visit (HOSPITAL_COMMUNITY)
Admission: RE | Admit: 2021-11-27 | Discharge: 2021-11-27 | Disposition: A | Discharge status: Home / Self Care | Payer: Medicare Other | Source: Ambulatory Visit | Attending: Cardiology | Admitting: Cardiology

## 2021-11-27 ENCOUNTER — Other Ambulatory Visit: Payer: Self-pay

## 2021-11-27 DIAGNOSIS — I5042 Chronic combined systolic (congestive) and diastolic (congestive) heart failure: Secondary | ICD-10-CM | POA: Diagnosis not present

## 2021-11-27 DIAGNOSIS — I10 Essential (primary) hypertension: Secondary | ICD-10-CM

## 2021-11-27 DIAGNOSIS — I428 Other cardiomyopathies: Secondary | ICD-10-CM

## 2021-11-27 DIAGNOSIS — E7800 Pure hypercholesterolemia, unspecified: Secondary | ICD-10-CM

## 2021-11-27 DIAGNOSIS — I6523 Occlusion and stenosis of bilateral carotid arteries: Secondary | ICD-10-CM | POA: Diagnosis present

## 2021-11-29 ENCOUNTER — Other Ambulatory Visit: Payer: Self-pay | Admitting: Family Medicine

## 2021-11-29 DIAGNOSIS — Z1231 Encounter for screening mammogram for malignant neoplasm of breast: Secondary | ICD-10-CM

## 2021-12-08 ENCOUNTER — Other Ambulatory Visit: Payer: Self-pay | Admitting: *Deleted

## 2021-12-08 DIAGNOSIS — I6523 Occlusion and stenosis of bilateral carotid arteries: Secondary | ICD-10-CM

## 2022-01-02 ENCOUNTER — Other Ambulatory Visit: Payer: Self-pay

## 2022-01-02 ENCOUNTER — Ambulatory Visit
Admission: RE | Admit: 2022-01-02 | Discharge: 2022-01-02 | Disposition: A | Discharge status: Home / Self Care | Payer: Medicare Other | Source: Ambulatory Visit | Attending: Family Medicine | Admitting: Family Medicine

## 2022-01-02 DIAGNOSIS — Z1231 Encounter for screening mammogram for malignant neoplasm of breast: Secondary | ICD-10-CM

## 2022-07-24 ENCOUNTER — Other Ambulatory Visit: Payer: Self-pay | Admitting: Family Medicine

## 2022-07-24 ENCOUNTER — Ambulatory Visit
Admission: RE | Admit: 2022-07-24 | Discharge: 2022-07-24 | Disposition: A | Discharge status: Home / Self Care | Payer: Medicare Other | Source: Ambulatory Visit | Attending: Family Medicine | Admitting: Family Medicine

## 2022-07-24 DIAGNOSIS — R55 Syncope and collapse: Secondary | ICD-10-CM

## 2022-07-24 DIAGNOSIS — R0989 Other specified symptoms and signs involving the circulatory and respiratory systems: Secondary | ICD-10-CM

## 2022-07-25 ENCOUNTER — Other Ambulatory Visit: Payer: Self-pay | Admitting: Family Medicine

## 2022-07-25 DIAGNOSIS — R55 Syncope and collapse: Secondary | ICD-10-CM

## 2022-07-26 ENCOUNTER — Ambulatory Visit
Admission: RE | Admit: 2022-07-26 | Discharge: 2022-07-26 | Disposition: A | Discharge status: Home / Self Care | Payer: Medicare Other | Source: Ambulatory Visit | Attending: Family Medicine | Admitting: Family Medicine

## 2022-07-26 DIAGNOSIS — R55 Syncope and collapse: Secondary | ICD-10-CM

## 2022-07-30 ENCOUNTER — Other Ambulatory Visit (HOSPITAL_COMMUNITY): Payer: Self-pay | Admitting: Family Medicine

## 2022-07-30 DIAGNOSIS — R55 Syncope and collapse: Secondary | ICD-10-CM

## 2022-08-06 ENCOUNTER — Ambulatory Visit (HOSPITAL_COMMUNITY): Payer: Medicare Other | Attending: Cardiovascular Disease

## 2022-08-06 DIAGNOSIS — R55 Syncope and collapse: Secondary | ICD-10-CM

## 2022-08-06 LAB — ECHOCARDIOGRAM COMPLETE
Area-P 1/2: 2.74 cm2
P 1/2 time: 400 msec
S' Lateral: 3.5 cm

## 2022-08-07 ENCOUNTER — Ambulatory Visit (HOSPITAL_COMMUNITY)
Admission: RE | Admit: 2022-08-07 | Discharge: 2022-08-07 | Disposition: A | Discharge status: Home / Self Care | Payer: Medicare Other | Source: Ambulatory Visit | Attending: Vascular Surgery | Admitting: Vascular Surgery

## 2022-08-07 DIAGNOSIS — I6523 Occlusion and stenosis of bilateral carotid arteries: Secondary | ICD-10-CM | POA: Diagnosis not present

## 2022-08-10 ENCOUNTER — Telehealth: Payer: Self-pay | Admitting: Cardiovascular Disease

## 2022-08-10 NOTE — Telephone Encounter (Signed)
Pt's PCP is calling in regards to trying to get a sooner appt with Dr. Claiborne Billings. Requesting to see Dr. Claiborne Billings and not a PA. States patient has experienced episodes of syncope and they have run tests on patient and states that they can not find anything wrong. Requesting call back.

## 2022-08-10 NOTE — Telephone Encounter (Signed)
Returned call to [facility] at Dr. Ila Mcgill appt scheduled with Dr. Claiborne Billings 10/3.   Requested appointment be with Dr. Claiborne Billings only.   Urgent appointment not needed per Sherri as Dr. Sandi Mariscal is doing a lot of testing.  She will fax results from testing once completed.

## 2022-08-13 ENCOUNTER — Other Ambulatory Visit: Payer: Self-pay | Admitting: *Deleted

## 2022-08-13 ENCOUNTER — Encounter: Payer: Self-pay | Admitting: *Deleted

## 2022-08-13 ENCOUNTER — Ambulatory Visit: Payer: Medicare Other | Attending: Family Medicine

## 2022-08-13 DIAGNOSIS — R55 Syncope and collapse: Secondary | ICD-10-CM

## 2022-08-13 NOTE — Progress Notes (Unsigned)
Enrolled for [employer] to mail a ZIO XT long term holter monitor to the patients address on file.   Letter with instructions mailed to patient.  Results to Dr. Derinda Late fax # [phone] or email pdf to [email or web address]

## 2022-08-14 ENCOUNTER — Telehealth: Payer: Self-pay | Admitting: Cardiovascular Disease

## 2022-08-14 NOTE — Telephone Encounter (Signed)
Patient's daughter is calling wanting to know if it is okay for the patient to wear her heart monitor tomorrow during her scheduled EEG. Please advise.

## 2022-08-14 NOTE — Telephone Encounter (Signed)
Daughter asked if the [facility] monitor would interfere with EEG. I informed her that it should not, but to call the EEG lab at [facility] to verify. She said she would.

## 2022-08-16 ENCOUNTER — Ambulatory Visit (HOSPITAL_COMMUNITY)
Admission: RE | Admit: 2022-08-16 | Discharge: 2022-08-16 | Disposition: A | Discharge status: Home / Self Care | Payer: Medicare Other | Source: Ambulatory Visit | Attending: Neurology | Admitting: Neurology

## 2022-08-16 DIAGNOSIS — R55 Syncope and collapse: Secondary | ICD-10-CM | POA: Diagnosis not present

## 2022-08-16 NOTE — Progress Notes (Signed)
EEG complete - results pending 

## 2022-08-16 NOTE — Procedures (Signed)
Patient Name: Rhonda Burke  MRN: [id number]  Epilepsy Attending: Lora Havens  Referring Physician/Provider: Dr. Hart Robinsons Date: 08/16/2022 Duration: 26.27 mins  Patient history: 88 year old female with syncope.  EEG to evaluate for seizure.  Level of alertness: Awake  AEDs during EEG study: None  Technical aspects: This EEG study was done with scalp electrodes positioned according to the 10-20 International system of electrode placement. Electrical activity was reviewed with band pass filter of 1-'70Hz'$ , sensitivity of 7 uV/mm, display speed of 11m/sec with a '60Hz'$  notched filter applied as appropriate. EEG data were recorded continuously and digitally stored.  Video monitoring was available and reviewed as appropriate.  Description: The posterior dominant rhythm consists of 9 Hz activity of moderate voltage (25-35 uV) seen predominantly in posterior head regions, symmetric and reactive to eye opening and eye closing. Physiologic photic driving was seen during photic stimulation.  Hyperventilation was not performed.     IMPRESSION: This study is within normal limits. No seizures or epileptiform discharges were seen throughout the recording.  A normal interictal EEG does not exclude nor support the diagnosis of epilepsy.   Joan Avetisyan OBarbra Sarks

## 2022-09-18 ENCOUNTER — Ambulatory Visit: Payer: Medicare Other | Attending: Cardiovascular Disease | Admitting: Cardiovascular Disease

## 2022-09-18 ENCOUNTER — Encounter: Payer: Self-pay | Admitting: Cardiovascular Disease

## 2022-09-18 VITALS — BP 140/74 | HR 78 | Ht 64.0 in | Wt 164.6 lb

## 2022-09-18 DIAGNOSIS — R55 Syncope and collapse: Secondary | ICD-10-CM | POA: Diagnosis present

## 2022-09-18 DIAGNOSIS — I447 Left bundle-branch block, unspecified: Secondary | ICD-10-CM | POA: Insufficient documentation

## 2022-09-18 DIAGNOSIS — I471 Supraventricular tachycardia, unspecified: Secondary | ICD-10-CM | POA: Insufficient documentation

## 2022-09-18 DIAGNOSIS — I10 Essential (primary) hypertension: Secondary | ICD-10-CM | POA: Diagnosis present

## 2022-09-18 DIAGNOSIS — E7800 Pure hypercholesterolemia, unspecified: Secondary | ICD-10-CM | POA: Insufficient documentation

## 2022-09-18 DIAGNOSIS — I6523 Occlusion and stenosis of bilateral carotid arteries: Secondary | ICD-10-CM

## 2022-09-18 MED ORDER — CARVEDILOL 6.25 MG PO TABS: ORAL_TABLET | ORAL | 2 refills | Status: DC

## 2022-09-18 NOTE — Progress Notes (Unsigned)
Patient ID: Rhonda Burke, female   DOB: 03-Dec-1927, 88 y.o.   MRN: [id number]      Primary MD: Dr. Derinda Late  HPI: Rhonda Burke is a 88 y.o. female who presents to the office for a 46 month cardiology evaluation.  Rhonda Burke has a history of left bundle branch block which was discovered when she presented for a routine colonoscopy and had significant ectopy an ECG in 2008.  I saw her for cardiology evaluation and echo Doppler study showed an ejection fraction of 35% with mild pulmonary hypertension. A nuclear perfusion study raised the possibility of mild ischemia and consequently she underwent a right and left heart catheterization which showed an ejection fraction of 35-40% and mild pulmonary hypertension with a PA pressure 35/15. Her LV pressure was 135/13. She was treated with medical therapy. An echo Doppler study in February 2012 showed an ejection fraction of 40-45% and she had septal wall motion abnormality due to her conduction abnormality. There was still evidence of pulmonary hypertension with estimated RV systolic pressure 41 mm there was evidence for aortic valve sclerosis.  Additional problems include hyperlipidemia and hypertension. She has tolerated a left knee replacement surgery by Dr. Sandie Ano 2012 about cardiovascular problems. Her last stress test was done preoperatively in February 2012 which showed normal perfusion.  She is status post right nephrectomy for angiomyolipoma.  On 02/23/2015 she underwent a follow-up echo Doppler study.  This showed an ejection fraction of 40-45%.  There was grade 1 diastolic dysfunction.  There was trivial aortic insufficiency.  There was mild pulmonary hypertension with a PA pressure 35 mm.  I saw her in October 2017.  A carotid duplex evaluation in June 2017 showed 39% bilateral proximal internal carotid artery stenoses.  A cardiac monitor in January 2018  showed sinus rhythm throughout the monitoring.  With an average heart rate  at 71 bpm.  There was a rare PAC.  She has had some issues with her leg.  She is status post left knee replacement and has undergone injections into the other knee.    Laboratory obtained on January 14, 2018 by Dr. Sandi Mariscal revealed a creatinine of 1.06, BUN 25, potassium 4.7 which have been taking in   I last saw her in September 2020.  Over the past year and a half, she has been without any episodes of chest pain or significant shortness of breath.  Her activity level is less particularly in this COVID 19 pandemic.  She was unaware of any presyncope or syncope or episodes of tachycardia.  She saw Dr. Sandi Mariscal in December 2020.  Since I last saw her, unfortunately her husband passed away on Easter Sunday 2021.  She has remained stable without chest pain or shortness of breath.  She denies palpitations.  She is unaware of leg swelling.  She presents for evaluation.  Past Medical History:  Diagnosis Date   Angiomyolipoma    right   Aortic sclerosis    Asthma, mild intermittent    Bilateral hearing loss    Bowel obstruction (HCC)    sigmoid ulceration and obstruction   Breast thickening    right breast   Degenerative disc disease    C-spine   Fibrocystic breast disease    Hypercholesteremia    Hypertension    LBBB (left bundle branch block)    Mitral regurgitation    Osteopenia    Pulmonary hypertension (HCC)    Venous insufficiency     Past Surgical History:  Procedure  Laterality Date   APPENDECTOMY  12/2001   BREAST EXCISIONAL BIOPSY Right 1999   CARDIAC CATHETERIZATION  10/02/2007   moderate LV dysfunction, EF 35-40%, conc LVH, normal coronaries, mild pulm HTN (Dr. Corky Downs)   CATARACT EXTRACTION Bilateral 2004   CHOLECYSTECTOMY  2003   COLECTOMY  12/2001   left sigmoid, obstruction/diverticulitis   COLON SURGERY     bowel obstruction and ulceration   HERNIA REPAIR     NEPHRECTOMY  12/2001   right, angiomyolipoma   NM MYOCAR PERF WALL MOTION  01/2011   dipyridamole myoview;  normal pattern of perfusion in all regions, EF 61%, normal, low risk scan    TOTAL KNEE ARTHROPLASTY Left 03/16/2011   Dr Maureen Ralphs   TRANSTHORACIC ECHOCARDIOGRAM  01/2011   EF 40-45%, mild conc LVH; RV mildly dilated; mild mitral annular calcif, mild MR; mild TR, RSVP elevated at 40-34mHg (mild pulm htn); AV mildly sclerotic     No Known Allergies  Current Outpatient Medications  Medication Sig Dispense Refill   calcium carbonate (OSCAL) 1500 (600 Ca) MG TABS tablet Take 1,500 mg by mouth 2 (two) times daily with a meal. Take Twice Daily with Food     carvedilol (COREG) 6.25 MG tablet Take 6.25 mg by mouth 2 (two) times daily with a meal.       lisinopril (ZESTRIL) 20 MG tablet Take 1.5 tablets (30 mg total) by mouth daily. 45 tablet 6   simvastatin (ZOCOR) 40 MG tablet Take 40 mg by mouth at bedtime.       No current facility-administered medications for this visit.    Social History   Socioeconomic History   Marital status: Married    Spouse name: Not on file   Number of children: 3   Years of education: Not on file   Highest education level: Not on file  Occupational History    Employer: RETIRED  Tobacco Use   Smoking status: Former   Smokeless tobacco: Never  Substance and Sexual Activity   Alcohol use: Yes    Alcohol/week: 1.0 standard drink of alcohol    Types: 1 Glasses of wine per week    Comment: one glass daily   Drug use: No   Sexual activity: Yes    Birth control/protection: Post-menopausal  Other Topics Concern   Not on file  Social History Narrative   Not on file   Social Determinants of Health   Financial Resource Strain: Not on file  Food Insecurity: Not on file  Transportation Needs: Not on file  Physical Activity: Not on file  Stress: Not on file  Social Connections: Not on file  Intimate Partner Violence: Not on file   Socially she is very has 3 children, 5 grandchildren one great-grandchild. There is no tobacco or alcohol use. She does not  routinely exercise.  Family History  Problem Relation Age of Onset   Heart disease Mother        also stroke   Heart disease Father        also stroke    Lung cancer Sister    Cancer Sister 731  ROS General: Negative; No fevers, chills, or night sweats;  HEENT: Negative; No changes in vision or hearing, sinus congestion, difficulty swallowing Pulmonary: Negative; No cough, wheezing, shortness of breath, hemoptysis Cardiovascular: Negative; No chest pain, presyncope, syncope, palpitations GI: Negative; No nausea, vomiting, diarrhea, or abdominal pain GU: Negative; No dysuria, hematuria, or difficulty voiding Musculoskeletal: Positive for degenerative disc disease; discomfort Hematologic/Oncology: Negative;  no easy bruising, bleeding Endocrine: Negative; no heat/cold intolerance; no diabetes Neuro: Negative; no changes in balance, headaches Skin: Negative; No rashes or skin lesions Psychiatric: Negative; No behavioral problems, depression Sleep: Negative; No snoring, daytime sleepiness, hypersomnolence, bruxism, restless legs, hypnogognic hallucinations, no cataplexy Other comprehensive 14 point system review is negative.   PE BP (!) 140/74   Pulse 78   Ht '5\' 4"'  (1.626 m)   Wt 164 lb 9.6 oz (74.7 kg)   SpO2 96%   BMI 28.25 kg/m    Repeat blood pressure by me was 136/78  Wt Readings from Last 3 Encounters:  09/18/22 164 lb 9.6 oz (74.7 kg)  08/14/21 168 lb 3.2 oz (76.3 kg)  08/11/20 166 lb (75.3 kg)     Physical Exam BP (!) 140/74   Pulse 78   Ht '5\' 4"'  (1.626 m)   Wt 164 lb 9.6 oz (74.7 kg)   SpO2 96%   BMI 28.25 kg/m  General: Alert, oriented, no distress.  Skin: normal turgor, no rashes, warm and dry HEENT: Normocephalic, atraumatic. Pupils equal round and reactive to light; sclera anicteric; extraocular muscles intact; Fundi ** Nose without nasal septal hypertrophy Mouth/Parynx benign; Mallinpatti scale Neck: No JVD, no carotid bruits; normal carotid  upstroke Lungs: clear to ausculatation and percussion; no wheezing or rales Chest wall: without tenderness to palpitation Heart: PMI not displaced, RRR, s1 s2 normal, 1/6 systolic murmur, no diastolic murmur, no rubs, gallops, thrills, or heaves Abdomen: soft, nontender; no hepatosplenomehaly, BS+; abdominal aorta nontender and not dilated by palpation. Back: no CVA tenderness Pulses 2+ Musculoskeletal: full range of motion, normal strength, no joint deformities Extremities: no clubbing cyanosis or edema, Homan's sign negative  Neurologic: grossly nonfocal; Cranial nerves grossly wnl Psychologic: Normal mood and affect    General: Alert, oriented, no distress.  Skin: normal turgor, no rashes, warm and dry HEENT: Normocephalic, atraumatic. Pupils equal round and reactive to light; sclera anicteric; extraocular muscles intact;  Nose without nasal septal hypertrophy Mouth/Parynx benign; Mallinpatti scale 2 Neck: No JVD, no carotid bruits; normal carotid upstroke Lungs: clear to ausculatation and percussion; no wheezing or rales Chest wall: without tenderness to palpitation Heart: PMI not displaced, RRR, s1 s2 normal, 1/6 systolic murmur, no diastolic murmur, no rubs, gallops, thrills, or heaves Abdomen: soft, nontender; no hepatosplenomehaly, BS+; abdominal aorta nontender and not dilated by palpation. Back: no CVA tenderness Pulses 2+ Musculoskeletal: Osteoarthritic changes of her hands bilaterally with Heberden's nodes,  normal strength, Extremities: no clubbing cyanosis or edema, Homan's sign negative  Neurologic: grossly nonfocal; Cranial nerves grossly wnl Psychologic: Normal mood and affect  September 18, 2022 ECG (independently read by me):  NSR at 78,LBBB  August 11, 2020 ECG (independently read by me): Normal sinus rhythm at 67 bpm, PAC, left bundle branch block  September 2020 ECG (independently read by me): Sinus rhythm at 73 with PAC; left bundle branch block  April 2019 ECG  (independently read by me): Normal sinus rhythm.  Left bundle branch block with repolarization changes.  PAC.  October 2017 ECG (independently read by me): Normal sinus rhythm at 60 bpm.  Left bundle-branch block with repolarization changes.  Normal intervals.  September 2000 ECG (independently read by me): Normal sinus rhythm with mild sinus arrhythmia.  Left bundle branch block with repolarization changes.  02/15/2015 ECG (independently read by me): Normal sinus rhythm at 67.  Left bundle branch block with repolarization changes.  Premature atrial complex.  March 2015 ECG (independently read by me): Normal  sinus rhythm with left bundle-branch block with a ventricular rate 69 beats per minute.  LABS:    Latest Ref Rng & Units 09/16/2019   11:28 AM 03/18/2011    5:40 AM 03/17/2011    5:00 AM  BMP  Glucose 65 - 99 mg/dL 96  130  138   BUN 10 - 36 mg/dL '25  9  13   ' Creatinine 0.57 - 1.00 mg/dL 1.11  0.72  0.74   BUN/Creat Ratio 12 - 28 23     Sodium 134 - 144 mmol/L 142  138  138   Potassium 3.5 - 5.2 mmol/L 5.3  4.1  4.7   Chloride 96 - 106 mmol/L 106  106  105   CO2 20 - 29 mmol/L '22  29  26   ' Calcium 8.7 - 10.3 mg/dL 9.8  8.7  8.1       Latest Ref Rng & Units 09/16/2019   11:28 AM 03/08/2011   11:25 AM  Hepatic Function  Total Protein 6.0 - 8.5 g/dL 6.6  7.5   Albumin 3.5 - 4.6 g/dL 4.3  4.0   AST 0 - 40 IU/L 24  33   ALT 0 - 32 IU/L 13  24   Alk Phosphatase 39 - 117 IU/L 63  50   Total Bilirubin 0.0 - 1.2 mg/dL 0.4  0.8       Latest Ref Rng & Units 03/19/2011    4:53 AM 03/18/2011    5:40 AM 03/17/2011    5:00 AM  CBC  WBC 4.0 - 10.5 K/uL 11.1  10.7  10.1   Hemoglobin 12.0 - 15.0 g/dL 9.6  10.2  10.8   Hematocrit 36.0 - 46.0 % 29.6  31.0  33.2   Platelets 150 - 400 K/uL 259  247  256    Lab Results  Component Value Date   MCV 95.2 03/19/2011   MCV 95.7 03/18/2011   MCV 96.8 03/17/2011   No results found for: "TSH".  Lipid Panel  No results found for: "CHOL", "TRIG",  "HDL", "CHOLHDL", "VLDL", "LDLCALC", "LDLDIRECT"   RADIOLOGY: No results found.  IMPRESSION: No diagnosis found.   ASSESSMENT AND PLAN: Rhonda Burke is a 88 year old female who has chronic left bundle branch block as well as hypertension.  Cardiac catheterization in 2008 showed normal coronary arteries.  She has a history of  mild pulmonary hypertension.  At that time, ejection fraction was 35-40%.  Her ejection fraction subsequently improved on a follow-up echo in 2012 at 40-45% and remained stable on f/u echo from March 2016.  When I last saw her a year ago blood pressure was elevated and further titration of medication was recommended.  Her blood pressure today on repeat by me was 136/78.  She continues to be on lisinopril at 30 mg in addition to carvedilol 6.25 mg twice a day.  ECG remained stable with sinus rhythm in the 60s with an isolated PAC.  Unfortunately her husband passed away in the morning of Easter Sunday 2021.  She is doing remarkably well.  She is euvolemic on exam.  Dr. Sandi Mariscal checks her laboratory.  She will monitor her blood pressure.  As long as she remains stable I will see her in 1 year for reevaluation or sooner as needed.   Troy Sine, MD, [facility]  09/18/2022 11:51 AM

## 2022-09-18 NOTE — Patient Instructions (Signed)
Medication Instructions:  Increase Carvedilol to 1.5 tablet (9.375 mg) in the morning and 1 tablet in the evening   *If you need a refill on your cardiac medications before your next appointment, please call your pharmacy*  Follow-Up: At [facility], you and your health needs are our priority.  As part of our continuing mission to provide you with exceptional heart care, we have created designated Provider Care Teams.  These Care Teams include your primary Cardiologist (physician) and Advanced Practice Providers (APPs -  Physician Assistants and Nurse Practitioners) who all work together to provide you with the care you need, when you need it.  We recommend signing up for the patient portal called "MyChart".  Sign up information is provided on this After Visit Summary.  MyChart is used to connect with patients for Virtual Visits (Telemedicine).  Patients are able to view lab/test results, encounter notes, upcoming appointments, etc.  Non-urgent messages can be sent to your provider as well.   To learn more about what you can do with MyChart, go to [email or web address].    Your next appointment:   6 month(s)  The format for your next appointment:   In Person  Provider:   Shelva Majestic, MD

## 2022-09-19 ENCOUNTER — Telehealth: Payer: Self-pay | Admitting: Cardiovascular Disease

## 2022-09-19 NOTE — Telephone Encounter (Signed)
Office is calling in bout monitor results for the patient. Please advise

## 2022-09-19 NOTE — Telephone Encounter (Signed)
Routed to Markus Daft to assist with sending monitor report to ordering provider's office

## 2022-09-20 ENCOUNTER — Encounter: Payer: Self-pay | Admitting: Cardiovascular Disease

## 2022-09-20 IMAGING — MG DIGITAL SCREENING BILAT W/ TOMO W/ CAD
8 series · 8 of 24 positions shown · non-contrast
Comparison: Previous exam(s).

CLINICAL DATA: Screening.

EXAM:
DIGITAL SCREENING BILATERAL MAMMOGRAM WITH TOMO AND CAD

[L MLO synth-2D]
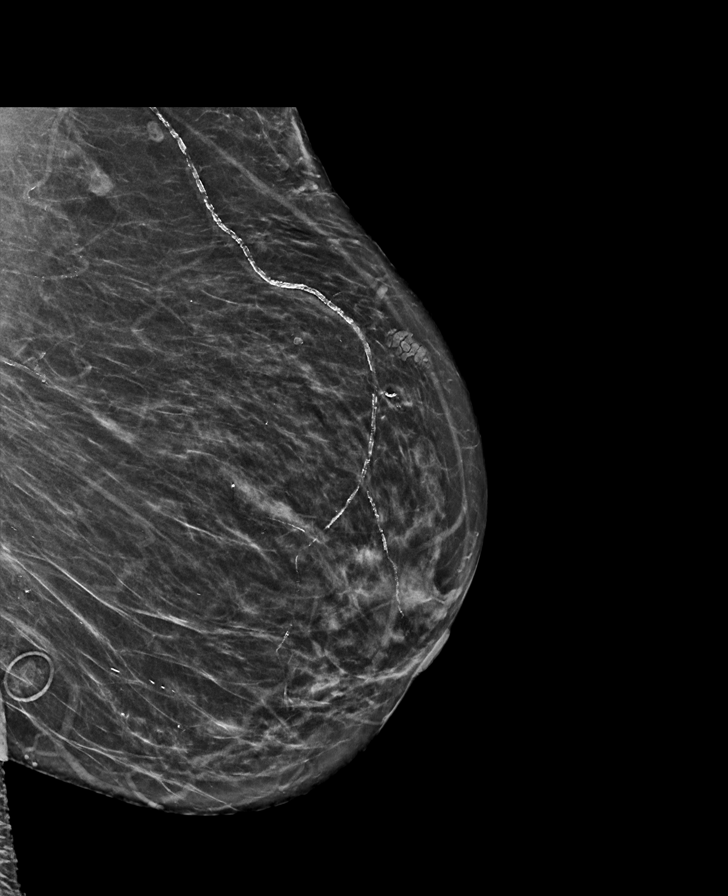

[R MLO synth-2D]
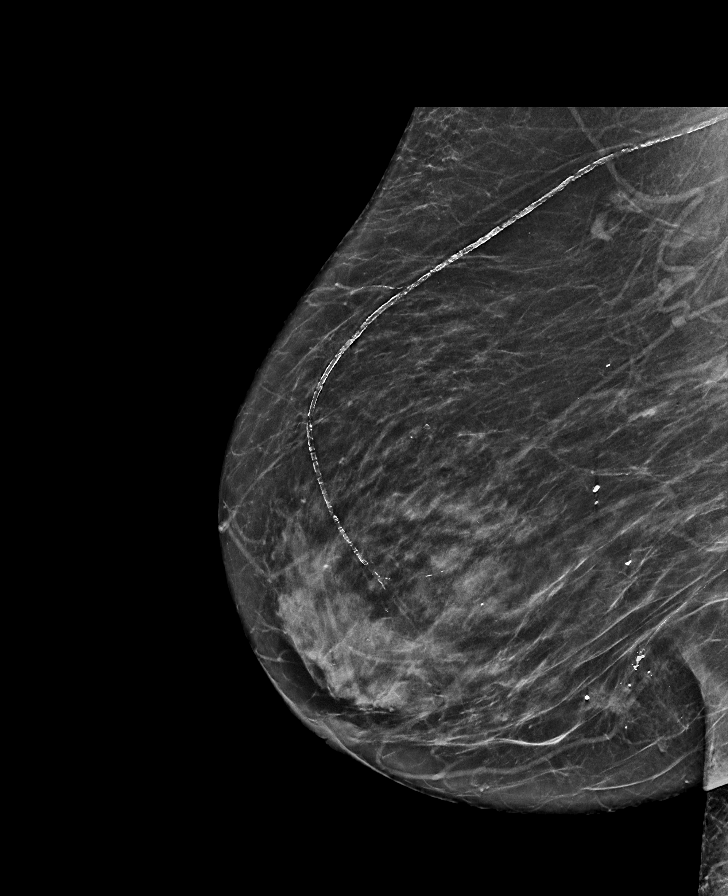

[L CC synth-2D]
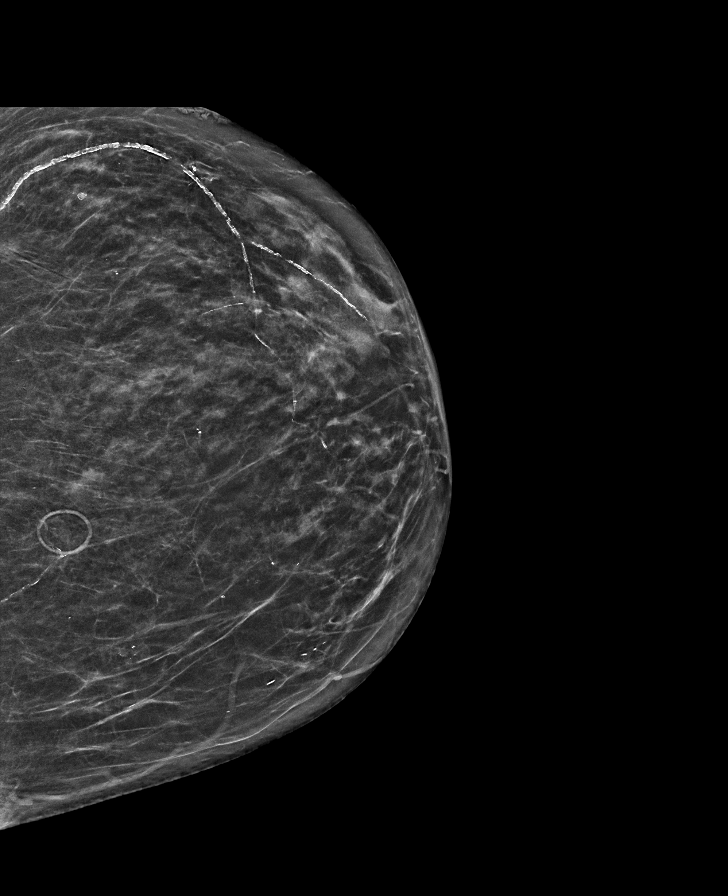

[R CC synth-2D]
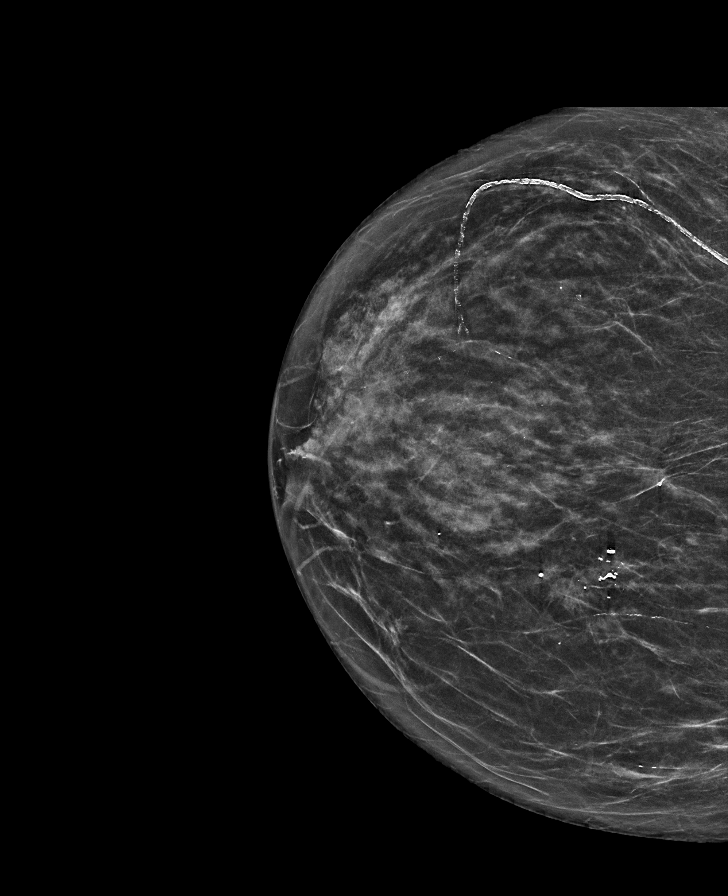

[L MLO tomo · tomo slice 33/65.0]
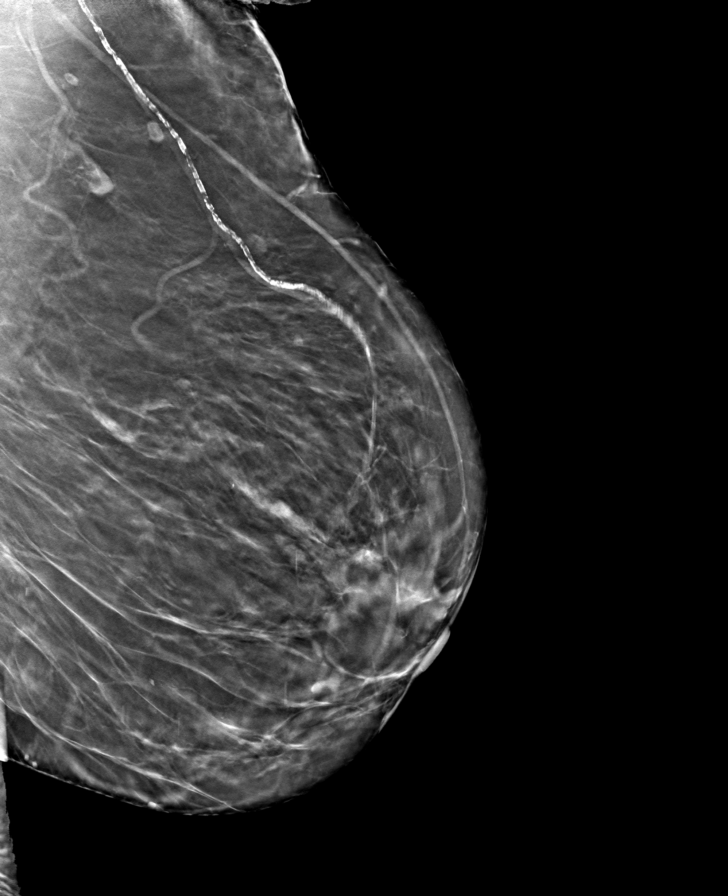

[L CC tomo · tomo slice 29/57.0]
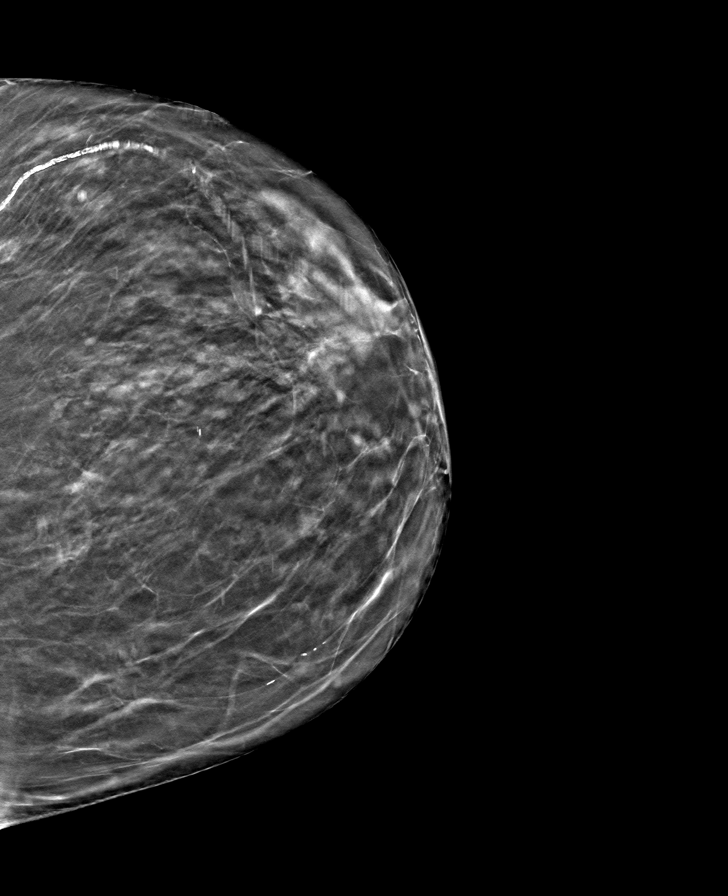

[R CC tomo · tomo slice 29/57.0]
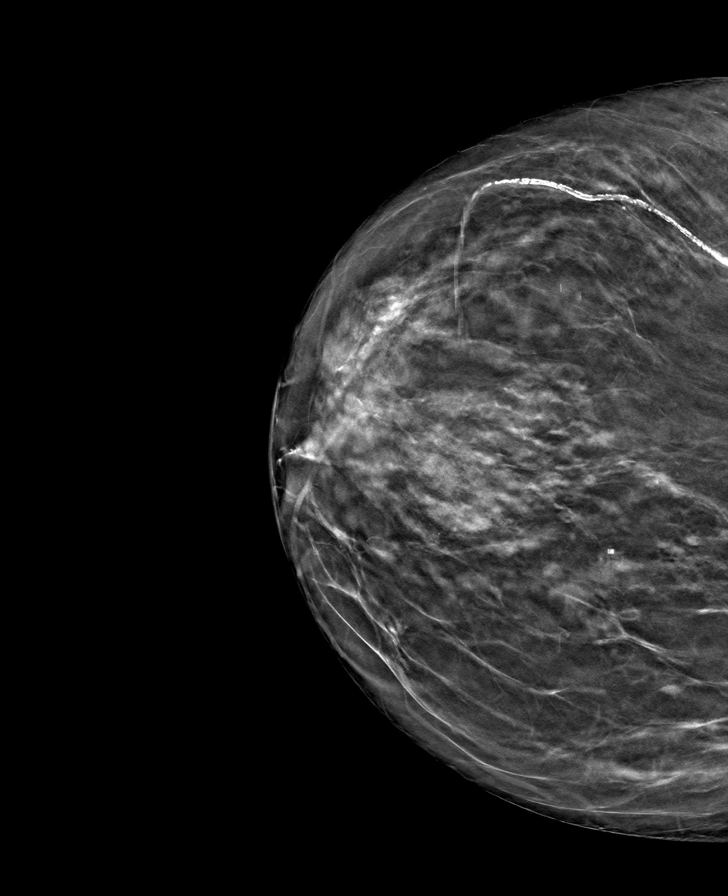

[R MLO tomo · tomo slice 34/67.0]
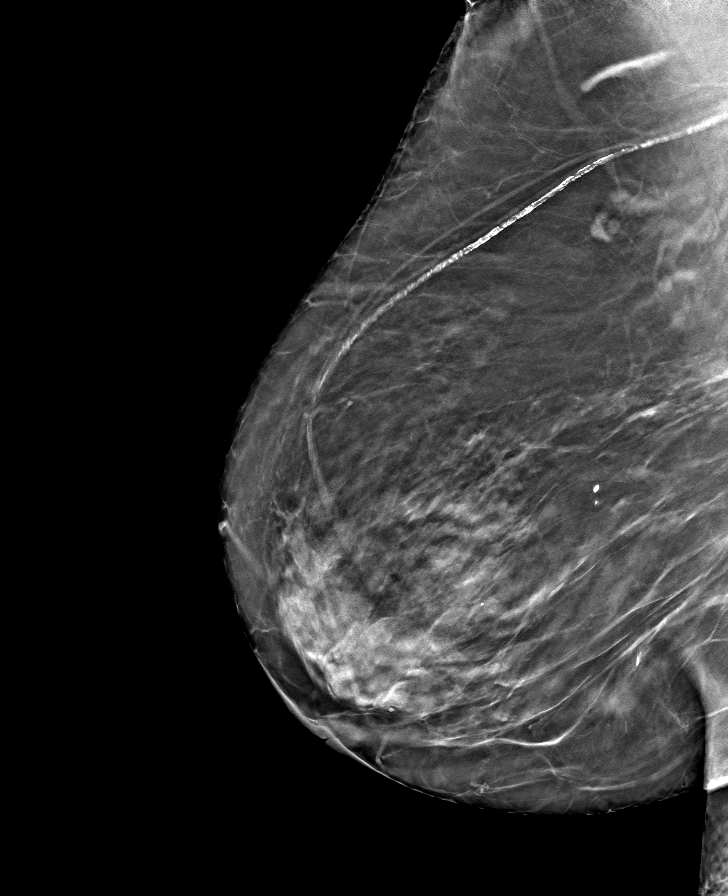

[8 of 24 positions shown; findings below may reference images not displayed]

ACR Breast Density Category c: The breast tissue is heterogeneously
dense, which may obscure small masses.
FINDINGS: There are no findings suspicious for malignancy. Images were
processed with CAD.
IMPRESSION: No mammographic evidence of malignancy. A result letter of this
screening mammogram will be mailed directly to the patient.

RECOMMENDATION:
Screening mammogram in one year. (Code:[id number]-[id number])

BI-RADS CATEGORY  1: Negative.

## 2022-09-20 NOTE — Telephone Encounter (Signed)
Results faxed to Albertine Patricia at Dr. Estill Bamberg office [phone].

## 2022-11-12 ENCOUNTER — Ambulatory Visit (INDEPENDENT_AMBULATORY_CARE_PROVIDER_SITE_OTHER): Payer: Medicare Other | Admitting: Neurology

## 2022-11-12 ENCOUNTER — Encounter: Payer: Self-pay | Admitting: Neurology

## 2022-11-12 VITALS — BP 153/71 | HR 73 | Ht 64.0 in | Wt 165.0 lb

## 2022-11-12 DIAGNOSIS — R55 Syncope and collapse: Secondary | ICD-10-CM | POA: Diagnosis not present

## 2022-11-12 NOTE — Progress Notes (Signed)
[facility]  PATIENT: Rhonda Burke DOB: [birth year]  REQUESTING CLINICIAN: Derinda Late, MD HISTORY FROM: Patient and daughter  REASON FOR VISIT: Syncope    HISTORICAL  CHIEF COMPLAINT:  Chief Complaint  Patient presents with   New Patient (Initial Visit)    Rm 15. Accompanied by daughter. NP/Paper referral for Syncopal episodes.    HISTORY OF PRESENT ILLNESS:  This is a 88 year old woman past medical history including hypertension and hyperlipidemia who is presenting after 2 episodes of syncope during the summer.  Per daughter the first episode happened in the month of July, they were outside, in the heat, patient was [place] the beach and daughter noted that patient was unresponsive, she went rigid, her eyes rolled back and was drooling.  This lasted about 30 seconds.  After the episode resolved, daughter denies any prolonged confusion and patient was acting appropriately.  They did not go to the hospital at that time.   Daughter reports the second episode happened a couple weeks later, again they were at the beach, patient was unresponsive for less than a minute, EMS was called, her initial vitals were normal.  There was no abnormal movement noted.  Patient remembered for this specific event waking up in the ambulance but daughter reported she was answering appropriately when EMS arrived.  Both episodes were attributed to dehydration.  She did have extensive workup including MRI, MRA head which showed no acute abnormality, and EEG which was normal.  Currently she is back to her normal self.      OTHER MEDICAL CONDITIONS: Hypertension, hyperlipidemia,    REVIEW OF SYSTEMS: Full 14 system review of systems performed and negative with exception of: As noted in the HPI   ALLERGIES: No Known Allergies  HOME MEDICATIONS: Outpatient Medications Prior to Visit  Medication Sig Dispense Refill   calcium carbonate (OSCAL) 1500 (600 Ca) MG TABS  tablet Take 1,500 mg by mouth 2 (two) times daily with a meal. Take Twice Daily with Food     carvedilol (COREG) 6.25 MG tablet Take 1.5 tablet (9.375 mg) in the AM, and 1 tablet (6.25 mg) in the PM. 180 tablet 2   lisinopril (ZESTRIL) 20 MG tablet Take 1.5 tablets (30 mg total) by mouth daily. 45 tablet 6   Multiple Vitamin (MULTIVITAMIN ADULT PO) Take 1 tablet by mouth daily.     Multiple Vitamins-Minerals (PRESERVISION AREDS 2 PO) Take 1 tablet by mouth daily.     simvastatin (ZOCOR) 40 MG tablet Take 40 mg by mouth at bedtime.       No facility-administered medications prior to visit.    PAST MEDICAL HISTORY: Past Medical History:  Diagnosis Date   Angiomyolipoma    right   Aortic sclerosis    Asthma, mild intermittent    Bilateral hearing loss    Bowel obstruction (HCC)    sigmoid ulceration and obstruction   Breast thickening    right breast   Degenerative disc disease    C-spine   Fibrocystic breast disease    Hypercholesteremia    Hypertension    LBBB (left bundle branch block)    Mitral regurgitation    Osteopenia    Pulmonary hypertension (HCC)    Venous insufficiency     PAST SURGICAL HISTORY: Past Surgical History:  Procedure Laterality Date   APPENDECTOMY  12/2001   BREAST EXCISIONAL BIOPSY Right 1999   CARDIAC CATHETERIZATION  10/02/2007   moderate LV dysfunction, EF 35-40%, conc LVH, normal  coronaries, mild pulm HTN (Dr. Corky Downs)   CATARACT EXTRACTION Bilateral 2004   CHOLECYSTECTOMY  2003   COLECTOMY  12/2001   left sigmoid, obstruction/diverticulitis   COLON SURGERY     bowel obstruction and ulceration   HERNIA REPAIR     NEPHRECTOMY  12/2001   right, angiomyolipoma   NM MYOCAR PERF WALL MOTION  01/2011   dipyridamole myoview; normal pattern of perfusion in all regions, EF 61%, normal, low risk scan    TOTAL KNEE ARTHROPLASTY Left 03/16/2011   Dr Maureen Ralphs   TRANSTHORACIC ECHOCARDIOGRAM  01/2011   EF 40-45%, mild conc LVH; RV mildly dilated; mild mitral  annular calcif, mild MR; mild TR, RSVP elevated at 40-59mHg (mild pulm htn); AV mildly sclerotic     FAMILY HISTORY: Family History  Problem Relation Age of Onset   Heart disease Mother        also stroke   Heart disease Father        also stroke    Lung cancer Sister    Cancer Sister [phone]   SOCIAL HISTORY: Social History   Socioeconomic History   Marital status: Married    Spouse name: Not on file   Number of children: 3   Years of education: Not on file   Highest education level: Not on file  Occupational History    Employer: RETIRED  Tobacco Use   Smoking status: Former   Smokeless tobacco: Never  Substance and Sexual Activity   Alcohol use: Yes    Alcohol/week: 1.0 standard drink of alcohol    Types: 1 Glasses of wine per week    Comment: one glass daily   Drug use: No   Sexual activity: Yes    Birth control/protection: Post-menopausal  Other Topics Concern   Not on file  Social History Narrative   Not on file   Social Determinants of Health   Financial Resource Strain: Not on file  Food Insecurity: Not on file  Transportation Needs: Not on file  Physical Activity: Not on file  Stress: Not on file  Social Connections: Not on file  Intimate Partner Violence: Not on file     PHYSICAL EXAM  GENERAL EXAM/CONSTITUTIONAL: Vitals:  Vitals:   11/12/22 1504  BP: (!) 153/71  Pulse: 73  Weight: 165 lb (74.8 kg)  Height: '5\' 4"'$  (1.626 m)   Body mass index is 28.32 kg/m. Wt Readings from Last 3 Encounters:  11/12/22 165 lb (74.8 kg)  09/18/22 164 lb 9.6 oz (74.7 kg)  08/14/21 168 lb 3.2 oz (76.3 kg)   Patient is in no distress; well developed, nourished and groomed; neck is supple  EYES: Visual fields full to confrontation, Extraocular movements intacts,   MUSCULOSKELETAL: Gait, strength, tone, movements noted in Neurologic exam below  NEUROLOGIC: MENTAL STATUS:      No data to display         awake, alert, oriented to person, place and  time recent and remote memory intact normal attention and concentration language fluent, comprehension intact, naming intact fund of knowledge appropriate  CRANIAL NERVE:  2nd, 3rd, 4th, 6th - Visual fields full to confrontation, extraocular muscles intact, no nystagmus 5th - facial sensation symmetric 7th - facial strength symmetric 8th - hearing intact 9th - palate elevates symmetrically, uvula midline 11th - shoulder shrug symmetric 12th - tongue protrusion midline  MOTOR:  normal bulk and tone, full strength in the BUE, BLE  SENSORY:  normal and symmetric to light touch  COORDINATION:  finger-nose-finger, fine finger movements normal  REFLEXES:  deep tendon reflexes present and symmetric  GAIT/STATION:  normal     DIAGNOSTIC DATA (LABS, IMAGING, TESTING) - I reviewed patient records, labs, notes, testing and imaging myself where available.  Lab Results  Component Value Date   WBC 11.1 (H) 03/19/2011   HGB 9.6 (L) 03/19/2011   HCT 29.6 (L) 03/19/2011   MCV 95.2 03/19/2011   PLT 259 03/19/2011      Component Value Date/Time   NA 142 09/16/2019 1128   K 5.3 (H) 09/16/2019 1128   CL 106 09/16/2019 1128   CO2 22 09/16/2019 1128   GLUCOSE 96 09/16/2019 1128   GLUCOSE 130 (H) 03/18/2011 0540   BUN 25 09/16/2019 1128   CREATININE 1.11 (H) 09/16/2019 1128   CALCIUM 9.8 09/16/2019 1128   PROT 6.6 09/16/2019 1128   ALBUMIN 4.3 09/16/2019 1128   AST 24 09/16/2019 1128   ALT 13 09/16/2019 1128   ALKPHOS 63 09/16/2019 1128   BILITOT 0.4 09/16/2019 1128   GFRNONAA 44 (L) 09/16/2019 1128   GFRAA 51 (L) 09/16/2019 1128   No results found for: "CHOL", "HDL", "LDLCALC", "LDLDIRECT", "TRIG", "CHOLHDL" No results found for: "HGBA1C" No results found for: "VITAMINB12" No results found for: "TSH"   Routine EEG 8/312/23 This study is within normal limits. No seizures or epileptiform discharges were seen throughout the recording. A normal interictal EEG does not  exclude nor support the diagnosis of epilepsy.   MRI Brain/MRA Head 07/26/22 1. No evidence of acute intracranial abnormality. 2. No emergent large vessel occlusion or proximal hemodynamically significant stenosis.   ASSESSMENT AND PLAN  88 y.o. year old female with medical history including hypertension, hyperlipidemia who is presenting after 2 syncopal episodes.  This was in the setting of being in the heat, at the beach, possible dehydration.  The first episode was associated with eyes rolled back, and drooling.  She did have extensive workup including MRI, MRA and EEG and they are all within normal limits.  At this time we will treat these events as syncope but seizure is in the differential.  I have advised patient and family to increase fluid intake, continue current medications and to contact me if she has another event, at that time, low threshold to start antiseizure medication.  They voiced understanding.   1. Syncope, unspecified syncope type      Patient Instructions  Continue current medications  Increase fluid intake  Follow up as needed    No orders of the defined types were placed in this encounter.   No orders of the defined types were placed in this encounter.   Return if symptoms worsen or fail to improve.  I have spent a total of 45 minutes dedicated to this patient today, preparing to see patient, performing a medically appropriate examination and evaluation, ordering tests and/or medications and procedures, and counseling and educating the patient/family/caregiver; independently interpreting result and communicating results to the family/patient/caregiver; and documenting clinical information in the electronic medical record.   Alric Ran, MD 11/12/2022, 7:58 PM  [facility] [address], [place], [place] [address] [phone]

## 2022-11-12 NOTE — Patient Instructions (Addendum)
Continue current medications  Increase fluid intake  Follow up as needed

## 2022-12-21 ENCOUNTER — Other Ambulatory Visit: Payer: Self-pay | Admitting: Family Medicine

## 2022-12-21 DIAGNOSIS — Z1231 Encounter for screening mammogram for malignant neoplasm of breast: Secondary | ICD-10-CM

## 2023-02-08 ENCOUNTER — Ambulatory Visit
Admission: RE | Admit: 2023-02-08 | Discharge: 2023-02-08 | Disposition: A | Discharge status: Home / Self Care | Payer: Medicare Other | Source: Ambulatory Visit | Attending: Family Medicine | Admitting: Family Medicine

## 2023-02-08 ENCOUNTER — Ambulatory Visit: Payer: Medicare Other

## 2023-02-08 DIAGNOSIS — Z1231 Encounter for screening mammogram for malignant neoplasm of breast: Secondary | ICD-10-CM

## 2023-02-19 ENCOUNTER — Other Ambulatory Visit: Payer: Self-pay | Admitting: Family Medicine

## 2023-02-19 ENCOUNTER — Ambulatory Visit
Admission: RE | Admit: 2023-02-19 | Discharge: 2023-02-19 | Disposition: A | Discharge status: Home / Self Care | Payer: Medicare Other | Source: Ambulatory Visit | Attending: Family Medicine | Admitting: Family Medicine

## 2023-02-19 DIAGNOSIS — R059 Cough, unspecified: Secondary | ICD-10-CM

## 2023-02-19 DIAGNOSIS — R0989 Other specified symptoms and signs involving the circulatory and respiratory systems: Secondary | ICD-10-CM

## 2024-01-14 ENCOUNTER — Other Ambulatory Visit: Payer: Self-pay | Admitting: Family Medicine

## 2024-01-14 DIAGNOSIS — Z1231 Encounter for screening mammogram for malignant neoplasm of breast: Secondary | ICD-10-CM

## 2024-02-14 ENCOUNTER — Ambulatory Visit
Admission: RE | Admit: 2024-02-14 | Discharge: 2024-02-14 | Disposition: A | Discharge status: Home / Self Care | Payer: Medicare Other | Source: Ambulatory Visit | Attending: Family Medicine | Admitting: Family Medicine

## 2024-02-14 DIAGNOSIS — Z1231 Encounter for screening mammogram for malignant neoplasm of breast: Secondary | ICD-10-CM

## 2024-04-01 ENCOUNTER — Ambulatory Visit
Admission: RE | Admit: 2024-04-01 | Discharge: 2024-04-01 | Disposition: A | Discharge status: Home / Self Care | Source: Ambulatory Visit | Attending: Family Medicine | Admitting: Family Medicine

## 2024-04-01 ENCOUNTER — Other Ambulatory Visit: Payer: Self-pay | Admitting: Family Medicine

## 2024-04-01 DIAGNOSIS — M25561 Pain in right knee: Secondary | ICD-10-CM

## 2024-04-02 ENCOUNTER — Other Ambulatory Visit: Payer: Self-pay | Admitting: Family Medicine

## 2024-04-02 ENCOUNTER — Ambulatory Visit
Admission: RE | Admit: 2024-04-02 | Discharge: 2024-04-02 | Disposition: A | Discharge status: Home / Self Care | Source: Ambulatory Visit | Attending: Family Medicine | Admitting: Family Medicine

## 2024-04-02 DIAGNOSIS — R609 Edema, unspecified: Secondary | ICD-10-CM

## 2024-04-06 ENCOUNTER — Ambulatory Visit (INDEPENDENT_AMBULATORY_CARE_PROVIDER_SITE_OTHER): Admitting: Podiatry

## 2024-04-06 ENCOUNTER — Encounter: Payer: Self-pay | Admitting: Podiatry

## 2024-04-06 DIAGNOSIS — L03031 Cellulitis of right toe: Secondary | ICD-10-CM

## 2024-04-06 DIAGNOSIS — L601 Onycholysis: Secondary | ICD-10-CM | POA: Diagnosis not present

## 2024-04-07 NOTE — Progress Notes (Signed)
  Subjective:  Patient ID: Rhonda Burke, female    DOB: [birth year],  MRN: [id number]  Chief Complaint  Patient presents with   Nail Problem    Hallux right - red, swollen, thick nail, started keflex 500mg  BID, can't take mortin (no kidney)    New Patient (Initial Visit)    89 y.o. female presents with the above complaint. History confirmed with patient.  She had a fall recently and this started shortly after.  Has been red and swollen.  She started cephalexin and is improving  Objective:  Physical Exam: warm, good capillary refill, no trophic changes or ulcerative lesions, normal DP and PT pulses, normal sensory exam, +1 peripheral edema, and varicosities noted on the right foot, there is onychomycosis and onycholysis of the right hallux nail plate with erythema and mild bleeding underneath hallux.  Assessment:   1. Onycholysis   2. Paronychia, toe, right      Plan:  Patient was evaluated and treated and all questions answered.  Has near traumatic avulsion of the right hallux nail with onycholysis I recommended removal of the remaining nail plate due to its risk of infection and redevelopment of paronychia when she complete the antibiotics.  We discussed the risk of this including the risks of nonhealing.  Continue remaining course of cephalexin.  Following consent and prep with alcohol the right hallux was anesthetized with 1.5 cc each of 2% lidocaine and 0.5% Marcaine plain.  The toe was exsanguinated and a tourniquet secured around the base of the toe.  A Freer elevator was used to excise the nail plate.  No matricectomy was performed there is no nailbed laceration to repair.  It was irrigated with alcohol and a Silvadene compression bandage was applied.  Post care instructions given.  Advised to monitor for signs of infection or nonhealing and return to see me if these develop  Return if symptoms worsen or fail to improve.

## 2024-04-15 ENCOUNTER — Telehealth: Payer: Self-pay | Admitting: Podiatry

## 2024-04-15 NOTE — Telephone Encounter (Signed)
 Post-op instructions was not given/attached with AVS. Daughter wants to know how long to leave a band-aid on. She thought it was until it scabbed over but wants to know a time frame.  Please advise.

## 2024-08-04 ENCOUNTER — Ambulatory Visit (INDEPENDENT_AMBULATORY_CARE_PROVIDER_SITE_OTHER): Admitting: Podiatry

## 2024-08-04 VITALS — Ht 64.0 in | Wt 160.0 lb

## 2024-08-04 DIAGNOSIS — M79675 Pain in left toe(s): Secondary | ICD-10-CM

## 2024-08-04 DIAGNOSIS — B351 Tinea unguium: Secondary | ICD-10-CM

## 2024-08-04 DIAGNOSIS — M79674 Pain in right toe(s): Secondary | ICD-10-CM

## 2024-08-04 NOTE — Progress Notes (Signed)
  Subjective:  Patient ID: Rhonda Burke, female    DOB: 05-Mar-1927,  MRN: [id number]  Chief Complaint  Patient presents with   Nail Problem    Rm 4 Patient is here for routine foot care and nail trimming. Patient has some redness on the right hallux. Patient would like a recommendation for orthopedic shoes.    89 y.o. female presents with the above complaint. History confirmed with patient.  She returns for follow-up the nail is doing better after healing, Not much nail is regrown.  The remaining nails are thick and elongated and causing pain and discomfort in shoe gear and she is unable to cut them..    Objective:  Physical Exam: warm, good capillary refill, no trophic changes or ulcerative lesions, normal DP and PT pulses, and normal sensory exam. Left Foot: dystrophic yellowed discolored nail plates with subungual debris Right Foot: dystrophic yellowed discolored nail plates with subungual debris minimal regrowth of nail and hallux  Assessment:   1. Pain due to onychomycosis of toenails of both feet      Plan:  Patient was evaluated and treated and all questions answered.  Discussed the etiology and treatment options for the condition in detail with the patient. Recommended debridement of the nails today. Sharp and mechanical debridement performed of all painful and mycotic nails today. Nails debrided in length and thickness using a nail nipper to level of comfort. Follow up as needed for painful nails.   Regarding supportive shoe gear I recommended Ortho feet for these.  Type shoes which should be easy for her to wear.  Her son is planning to take her to shoe market today to purchase.  Return in about 3 months (around 11/04/2024) for painful thick fungal nails.

## 2024-11-18 ENCOUNTER — Encounter: Payer: Self-pay | Admitting: Podiatry

## 2024-11-18 ENCOUNTER — Ambulatory Visit: Admitting: Podiatry

## 2024-11-18 DIAGNOSIS — M79674 Pain in right toe(s): Secondary | ICD-10-CM | POA: Diagnosis not present

## 2024-11-18 DIAGNOSIS — B351 Tinea unguium: Secondary | ICD-10-CM

## 2024-11-18 DIAGNOSIS — M79675 Pain in left toe(s): Secondary | ICD-10-CM | POA: Diagnosis not present

## 2024-11-22 NOTE — Progress Notes (Signed)
  Subjective:  Patient ID: Rhonda Burke, female    DOB: [birth year],  MRN: [id number]  Rhonda Burke presents to clinic today for painful thick toenails that are difficult to trim. Pain interferes with ambulation. Aggravating factors include wearing enclosed shoe gear. Pain is relieved with periodic professional debridement.  Chief Complaint  Patient presents with   RFC    Rm16 Routine foot care/ Dr. Maude Sprague    New problem(s): None.   PCP is Sprague Maude, MD.   No Known Allergies  Review of Systems: Negative except as noted in the HPI.  Objective: No changes noted in today's physical examination. There were no vitals filed for this visit. Rhonda Burke is a pleasant 89 y.o. female in NAD. AAO x 3.  Vascular Examination: Capillary refill time immediate b/l. Palpable pedal pulses. Pedal hair present b/l. Pedal edema absent. No pain with calf compression b/l. Skin temperature gradient WNL b/l. No cyanosis or clubbing b/l. No ischemia or gangrene noted b/l.   Neurological Examination: Sensation grossly intact b/l with 10 gram monofilament. Vibratory sensation intact b/l.   Dermatological Examination: Pedal skin with normal turgor, texture and tone b/l.  No open wounds. No interdigital macerations.   Incurvated nailplate left great toe medial and lateral border(s) with tenderness to palpation. No erythema, no edema, no drainage noted.  Toenails right great toe and 2-5 b/l thick, discolored, elongated with subungual debris and pain on dorsal palpation.   No hyperkeratotic nor porokeratotic lesions.  Musculoskeletal Examination: Muscle strength 5/5 to all lower extremity muscle groups bilaterally. No pain, crepitus or joint limitation noted with ROM bilateral LE. No gross bony deformities bilaterally.  Radiographs: None  Assessment/Plan: 1. Pain due to onychomycosis of toenails of both feet   -Consent given for treatment as described below: -Examined  patient. -Patient to continue soft, supportive shoe gear daily. -Toenails were debrided in length and girth 2-5 bilaterally and right great toe with sterile nail nippers and dremel without iatrogenic bleeding.  -No invasive procedure(s) performed. Offending nail border debrided and curretaged left great toe utilizing sterile nail nipper and currette. Pinpoint bleeding addressed with Lumicain Hemostatic solution. Border(s) cleansed with alcohol and triple antibiotic ointment applied. Patient/POA/Caregiver/Facility instructed to apply Neosporin Cream  to left great toe once daily for 7 days. Call office if there are any concerns.  Return in about 3 months (around 02/16/2025).  Rhonda Burke, DPM      [place] LOCATION: [address] [place] [address]                   Office [phone]   [facility] LOCATION: [address] [place], [place] [address] Office [phone]

## 2024-12-11 ENCOUNTER — Inpatient Hospital Stay (HOSPITAL_COMMUNITY)
Admission: EM | Admit: 2024-12-11 | Discharge: 2024-12-13 | DRG: 641 | Disposition: A | Discharge status: Home / Self Care | Attending: Internal Medicine | Admitting: Internal Medicine

## 2024-12-11 ENCOUNTER — Encounter (HOSPITAL_COMMUNITY): Payer: Self-pay

## 2024-12-11 ENCOUNTER — Emergency Department (HOSPITAL_COMMUNITY)

## 2024-12-11 ENCOUNTER — Other Ambulatory Visit: Payer: Self-pay

## 2024-12-11 DIAGNOSIS — I872 Venous insufficiency (chronic) (peripheral): Secondary | ICD-10-CM | POA: Diagnosis present

## 2024-12-11 DIAGNOSIS — M8580 Other specified disorders of bone density and structure, unspecified site: Secondary | ICD-10-CM | POA: Diagnosis present

## 2024-12-11 DIAGNOSIS — I447 Left bundle-branch block, unspecified: Secondary | ICD-10-CM | POA: Diagnosis present

## 2024-12-11 DIAGNOSIS — Z823 Family history of stroke: Secondary | ICD-10-CM

## 2024-12-11 DIAGNOSIS — Z79899 Other long term (current) drug therapy: Secondary | ICD-10-CM

## 2024-12-11 DIAGNOSIS — Z905 Acquired absence of kidney: Secondary | ICD-10-CM

## 2024-12-11 DIAGNOSIS — Z9049 Acquired absence of other specified parts of digestive tract: Secondary | ICD-10-CM

## 2024-12-11 DIAGNOSIS — N390 Urinary tract infection, site not specified: Secondary | ICD-10-CM | POA: Diagnosis present

## 2024-12-11 DIAGNOSIS — J4520 Mild intermittent asthma, uncomplicated: Secondary | ICD-10-CM | POA: Diagnosis present

## 2024-12-11 DIAGNOSIS — I340 Nonrheumatic mitral (valve) insufficiency: Secondary | ICD-10-CM | POA: Diagnosis present

## 2024-12-11 DIAGNOSIS — E7800 Pure hypercholesterolemia, unspecified: Secondary | ICD-10-CM | POA: Diagnosis present

## 2024-12-11 DIAGNOSIS — I272 Pulmonary hypertension, unspecified: Secondary | ICD-10-CM | POA: Diagnosis present

## 2024-12-11 DIAGNOSIS — Z96652 Presence of left artificial knee joint: Secondary | ICD-10-CM | POA: Diagnosis present

## 2024-12-11 DIAGNOSIS — Z801 Family history of malignant neoplasm of trachea, bronchus and lung: Secondary | ICD-10-CM

## 2024-12-11 DIAGNOSIS — I3139 Other pericardial effusion (noninflammatory): Secondary | ICD-10-CM | POA: Diagnosis present

## 2024-12-11 DIAGNOSIS — Z87891 Personal history of nicotine dependence: Secondary | ICD-10-CM

## 2024-12-11 DIAGNOSIS — I428 Other cardiomyopathies: Secondary | ICD-10-CM | POA: Diagnosis present

## 2024-12-11 DIAGNOSIS — H9193 Unspecified hearing loss, bilateral: Secondary | ICD-10-CM | POA: Diagnosis present

## 2024-12-11 DIAGNOSIS — I110 Hypertensive heart disease with heart failure: Secondary | ICD-10-CM | POA: Diagnosis present

## 2024-12-11 DIAGNOSIS — I509 Heart failure, unspecified: Secondary | ICD-10-CM

## 2024-12-11 DIAGNOSIS — N179 Acute kidney failure, unspecified: Secondary | ICD-10-CM | POA: Diagnosis present

## 2024-12-11 DIAGNOSIS — R55 Syncope and collapse: Principal | ICD-10-CM | POA: Insufficient documentation

## 2024-12-11 DIAGNOSIS — I480 Paroxysmal atrial fibrillation: Secondary | ICD-10-CM | POA: Diagnosis present

## 2024-12-11 DIAGNOSIS — E860 Dehydration: Principal | ICD-10-CM | POA: Diagnosis present

## 2024-12-11 DIAGNOSIS — I959 Hypotension, unspecified: Principal | ICD-10-CM | POA: Diagnosis present

## 2024-12-11 DIAGNOSIS — F0390 Unspecified dementia without behavioral disturbance: Secondary | ICD-10-CM | POA: Diagnosis present

## 2024-12-11 DIAGNOSIS — Z8249 Family history of ischemic heart disease and other diseases of the circulatory system: Secondary | ICD-10-CM

## 2024-12-11 DIAGNOSIS — I5042 Chronic combined systolic (congestive) and diastolic (congestive) heart failure: Secondary | ICD-10-CM | POA: Diagnosis present

## 2024-12-11 LAB — CBC WITH DIFFERENTIAL/PLATELET
Abs Immature Granulocytes: 0.03 K/uL (ref 0.00–0.07)
Basophils Absolute: 0 K/uL (ref 0.0–0.1)
Basophils Relative: 1 %
Eosinophils Absolute: 0.2 K/uL (ref 0.0–0.5)
Eosinophils Relative: 4 %
HCT: 32.8 % — ABNORMAL LOW (ref 36.0–46.0)
Hemoglobin: 10.8 g/dL — ABNORMAL LOW (ref 12.0–15.0)
Immature Granulocytes: 1 %
Lymphocytes Relative: 42 %
Lymphs Abs: 2.7 K/uL (ref 0.7–4.0)
MCH: 33 pg (ref 26.0–34.0)
MCHC: 32.9 g/dL (ref 30.0–36.0)
MCV: 100.3 fL — ABNORMAL HIGH (ref 80.0–100.0)
Monocytes Absolute: 0.7 K/uL (ref 0.1–1.0)
Monocytes Relative: 11 %
Neutro Abs: 2.7 K/uL (ref 1.7–7.7)
Neutrophils Relative %: 41 %
Platelets: 259 K/uL (ref 150–400)
RBC: 3.27 MIL/uL — ABNORMAL LOW (ref 3.87–5.11)
RDW: 13.6 % (ref 11.5–15.5)
WBC: 6.4 K/uL (ref 4.0–10.5)
nRBC: 0 % (ref 0.0–0.2)

## 2024-12-11 LAB — COMPREHENSIVE METABOLIC PANEL WITH GFR
ALT: 11 U/L (ref 0–44)
AST: 27 U/L (ref 15–41)
Albumin: 3.2 g/dL — ABNORMAL LOW (ref 3.5–5.0)
Alkaline Phosphatase: 48 U/L (ref 38–126)
Anion gap: 12 (ref 5–15)
BUN: 35 mg/dL — ABNORMAL HIGH (ref 8–23)
CO2: 18 mmol/L — ABNORMAL LOW (ref 22–32)
Calcium: 8.8 mg/dL — ABNORMAL LOW (ref 8.9–10.3)
Chloride: 107 mmol/L (ref 98–111)
Creatinine, Ser: 1.24 mg/dL — ABNORMAL HIGH (ref 0.44–1.00)
GFR, Estimated: 40 mL/min — ABNORMAL LOW
Glucose, Bld: 125 mg/dL — ABNORMAL HIGH (ref 70–99)
Potassium: 4.9 mmol/L (ref 3.5–5.1)
Sodium: 137 mmol/L (ref 135–145)
Total Bilirubin: <0.2 mg/dL (ref 0.0–1.2)
Total Protein: 5.7 g/dL — ABNORMAL LOW (ref 6.5–8.1)

## 2024-12-11 LAB — I-STAT CHEM 8, ED
BUN: 47 mg/dL — ABNORMAL HIGH (ref 8–23)
Calcium, Ion: 1.22 mmol/L (ref 1.15–1.40)
Chloride: 108 mmol/L (ref 98–111)
Creatinine, Ser: 1.4 mg/dL — ABNORMAL HIGH (ref 0.44–1.00)
Glucose, Bld: 101 mg/dL — ABNORMAL HIGH (ref 70–99)
HCT: 33 % — ABNORMAL LOW (ref 36.0–46.0)
Hemoglobin: 11.2 g/dL — ABNORMAL LOW (ref 12.0–15.0)
Potassium: 5.3 mmol/L — ABNORMAL HIGH (ref 3.5–5.1)
Sodium: 138 mmol/L (ref 135–145)
TCO2: 22 mmol/L (ref 22–32)

## 2024-12-11 LAB — TROPONIN T, HIGH SENSITIVITY: Troponin T High Sensitivity: 27 ng/L — ABNORMAL HIGH (ref 0–19)

## 2024-12-11 LAB — CBG MONITORING, ED: Glucose-Capillary: 133 mg/dL — ABNORMAL HIGH (ref 70–99)

## 2024-12-11 LAB — PRO BRAIN NATRIURETIC PEPTIDE: Pro Brain Natriuretic Peptide: 1704 pg/mL — ABNORMAL HIGH

## 2024-12-11 NOTE — ED Triage Notes (Signed)
 Pt BIB [facility] from home with hypotension and a syncopal episode at home @ 2130. Pt does not remember going unconscious.    En route pt received 500 ml of LR   20G in LFA   158 CBG  96/32  60 HR

## 2024-12-11 NOTE — ED Provider Notes (Incomplete)
 " [facility] Provider Note   CSN: [id number] Arrival date & time: 12/11/24  2216     Patient presents with: Near Syncope and Hypotension   Rhonda Burke is a 89 y.o. female who presents after syncopal episode.  Per EMS patient's family reported that she was sitting at dinner with them behaving normally when she suddenly became unresponsive, slumped in her chair.  Was breathing on her own but was unconscious for approximately 10 minutes.  When EMS arrived heart rate was in the 60s, palpable carotid pulses but no radial pulses, BP was 50 systolic.  Pressures improved to the 90s when lying flat she regained consciousness, patient received 500 mL of LR and route with EMS and had normal CBG.  Patient denies any presyncopal symptoms, appears to be back to her mental status baseline at this time with GCS of 15, conversant.  States that she was having a very enjoyable time with many of her grandchildren, only thing out of the ordinary was that she had a glass of wine with her dinner tonight.  States she is feeling back to her normal state of health at this time.  History of left bundle branch block, nonischemic cardiomyopathy with a EF of 55 to 60% with grade 2 diastolic dysfunction, paroxysmal A-fib not currently on any anticoagulation.  She is on lisinopril  and carvedilol  for her hypertension.  {Add pertinent medical, surgical, social history, OB history to HPI:32947} HPI     Prior to Admission medications  Medication Sig Start Date End Date Taking? Authorizing Provider  calcium carbonate (OSCAL) 1500 (600 Ca) MG TABS tablet Take 1,500 mg by mouth 2 (two) times daily with a meal. Take Twice Daily with Food    [provider]  carvedilol  (COREG ) 6.25 MG tablet Take 1.5 tablet (9.375 mg) in the AM, and 1 tablet (6.25 mg) in the PM. 09/18/22   Burnard Debby DELENA, MD  cephALEXin (KEFLEX) 500 MG capsule Take 500 mg by mouth 2 (two) times daily.  04/01/24   [provider]  lisinopril  (ZESTRIL ) 20 MG tablet Take 1.5 tablets (30 mg total) by mouth daily. 09/02/19   Burnard Debby DELENA, MD  Multiple Vitamin (MULTIVITAMIN ADULT PO) Take 1 tablet by mouth daily.    [provider]  Multiple Vitamins-Minerals (PRESERVISION AREDS 2 PO) Take 1 tablet by mouth daily.    [provider]  simvastatin (ZOCOR) 40 MG tablet Take 40 mg by mouth at bedtime.      [provider]    Allergies: Patient has no known allergies.    Review of Systems  Constitutional: Negative.   Respiratory: Negative.    Cardiovascular: Negative.   Neurological:  Positive for syncope.    Updated Vital Signs BP (!) 115/46 (BP Location: Right Arm)   Pulse 68   Temp (!) 97.5 F (36.4 C) (Oral)   Resp (!) 22   SpO2 100%   Physical Exam Vitals and nursing note reviewed.  Constitutional:      Appearance: She is not ill-appearing or toxic-appearing.  HENT:     Head: Normocephalic and atraumatic.     Mouth/Throat:     Mouth: Mucous membranes are moist.     Pharynx: No oropharyngeal exudate or posterior oropharyngeal erythema.  Eyes:     General:        Right eye: No discharge.        Left eye: No discharge.     Extraocular Movements: Extraocular  movements intact.     Conjunctiva/sclera: Conjunctivae normal.     Pupils: Pupils are equal, round, and reactive to light.  Cardiovascular:     Rate and Rhythm: Normal rate and regular rhythm.     Pulses: Normal pulses.          Radial pulses are 2+ on the right side and 2+ on the left side.     Heart sounds: S1 normal and S2 normal.  Pulmonary:     Effort: Pulmonary effort is normal. No respiratory distress.     Breath sounds: Normal breath sounds. No wheezing or rales.  Abdominal:     General: Bowel sounds are normal. There is no distension.     Palpations: Abdomen is soft.     Tenderness: There is no abdominal tenderness. There is no guarding or rebound.  Musculoskeletal:         General: No deformity.     Cervical back: Neck supple.     Right lower leg: 1+ Edema present.     Left lower leg: Edema present.  Skin:    General: Skin is warm and dry.     Capillary Refill: Capillary refill takes less than 2 seconds.  Neurological:     General: No focal deficit present.     Mental Status: She is alert and oriented to person, place, and time. Mental status is at baseline.  Psychiatric:        Mood and Affect: Mood normal.     (all labs ordered are listed, but only abnormal results are displayed) Labs Reviewed - No data to display  EKG: None  Radiology: No results found.  {Document cardiac monitor, telemetry assessment procedure when appropriate:32947} Procedures   Medications Ordered in the ED - No data to display    {Click here for ABCD2, HEART and other calculators REFRESH Note before signing:1}                              Medical Decision Making  ***  {Document critical care time when appropriate  Document review of labs and clinical decision tools ie CHADS2VASC2, etc  Document your independent review of radiology images and any outside records  Document your discussion with family members, caretakers and with consultants  Document social determinants of health affecting pt's care  Document your decision making why or why not admission, treatments were needed:32947:::1}   Final diagnoses:  None    ED Discharge Orders     None        "

## 2024-12-11 NOTE — ED Provider Notes (Signed)
 "  EMERGENCY DEPARTMENT AT [facility] Provider Note   CSN: [id number] Arrival date & time: 12/11/24  2216     Patient presents with: Near Syncope and Hypotension   Rhonda Burke is a 89 y.o. female who presents after syncopal episode.  Per EMS patient's family reported that she was sitting at dinner with them behaving normally when she suddenly became unresponsive, slumped in her chair.  Was breathing on her own but was unconscious for approximately 10 minutes.  When EMS arrived heart rate was in the 60s, palpable carotid pulses but no radial pulses, BP was 50 systolic.  Pressures improved to the 90s when lying flat she regained consciousness, patient received 500 mL of LR and route with EMS and had normal CBG.  Patient denies any presyncopal symptoms, appears to be back to her mental status baseline at this time with GCS of 15, conversant.  States that she was having a very enjoyable time with many of her grandchildren, only thing out of the ordinary was that she had a glass of wine with her dinner tonight.  States she is feeling back to her normal state of health at this time.  History of left bundle branch block, nonischemic cardiomyopathy with a EF of 55 to 60% with grade 2 diastolic dysfunction, paroxysmal A-fib not currently on any anticoagulation.  She is on lisinopril  and carvedilol  for her hypertension.   HPI     Prior to Admission medications  Medication Sig Start Date End Date Taking? Authorizing Provider  calcium carbonate (OSCAL) 1500 (600 Ca) MG TABS tablet Take 1,500 mg by mouth 2 (two) times daily with a meal. Take Twice Daily with Food    [provider]  carvedilol  (COREG ) 6.25 MG tablet Take 1.5 tablet (9.375 mg) in the AM, and 1 tablet (6.25 mg) in the PM. 09/18/22   Burnard Debby DELENA, MD  cephALEXin (KEFLEX) 500 MG capsule Take 500 mg by mouth 2 (two) times daily. 04/01/24   [provider]  lisinopril  (ZESTRIL ) 20 MG tablet Take 1.5  tablets (30 mg total) by mouth daily. 09/02/19   Burnard Debby DELENA, MD  Multiple Vitamin (MULTIVITAMIN ADULT PO) Take 1 tablet by mouth daily.    [provider]  Multiple Vitamins-Minerals (PRESERVISION AREDS 2 PO) Take 1 tablet by mouth daily.    [provider]  simvastatin  (ZOCOR ) 40 MG tablet Take 40 mg by mouth at bedtime.      [provider]    Allergies: Patient has no known allergies.    Review of Systems  Constitutional: Negative.   Respiratory: Negative.    Cardiovascular: Negative.   Neurological:  Positive for syncope.    Updated Vital Signs BP (!) 115/46 (BP Location: Right Arm)   Pulse 68   Temp (!) 97.5 F (36.4 C) (Oral)   Resp (!) 22   SpO2 100%   Physical Exam Vitals and nursing note reviewed.  Constitutional:      Appearance: She is not ill-appearing or toxic-appearing.  HENT:     Head: Normocephalic and atraumatic.     Mouth/Throat:     Mouth: Mucous membranes are moist.     Pharynx: No oropharyngeal exudate or posterior oropharyngeal erythema.  Eyes:     General:        Right eye: No discharge.        Left eye: No discharge.     Extraocular Movements: Extraocular movements intact.     Conjunctiva/sclera: Conjunctivae normal.  Pupils: Pupils are equal, round, and reactive to light.  Cardiovascular:     Rate and Rhythm: Normal rate and regular rhythm.     Pulses: Normal pulses.          Radial pulses are 2+ on the right side and 2+ on the left side.     Heart sounds: S1 normal and S2 normal.  Pulmonary:     Effort: Pulmonary effort is normal. No respiratory distress.     Breath sounds: Normal breath sounds. No wheezing or rales.  Abdominal:     General: Bowel sounds are normal. There is no distension.     Palpations: Abdomen is soft.     Tenderness: There is no abdominal tenderness. There is no guarding or rebound.  Musculoskeletal:        General: No deformity.     Cervical back: Neck supple.     Right lower leg:  1+ Edema present.     Left lower leg: Edema present.  Skin:    General: Skin is warm and dry.     Capillary Refill: Capillary refill takes less than 2 seconds.  Neurological:     General: No focal deficit present.     Mental Status: She is alert and oriented to person, place, and time. Mental status is at baseline.  Psychiatric:        Mood and Affect: Mood normal.     (all labs ordered are listed, but only abnormal results are displayed) Labs Reviewed - No data to display  EKG: None  Radiology: No results found.   Procedures   Medications Ordered in the ED - No data to display  Clinical Course as of 12/12/24 0222  Sat Dec 12, 2024  0112 Consult to Dr. Dena, hospitalist, who is amenable to admitting this patient to his service for syncope without prodrome. I appreciate his collaboration in the care of this patient.  [RS]    Clinical Course User Index [RS] Bobette Pleasant SAUNDERS, PA-C                                 Medical Decision Making 89 y/o female who presents after syncopal episode.   Mildly tachypneic on intake, vital signs otherwise normal.  Cardiopulmonary exam is reassuring, abdominal exam is benign.  Mild lower extremity edema.  The differential for syncope is extensive and includes, but is not limited to: arrythmia (Vtach, SVT, SSS, sinus arrest, AV block, bradycardia), aortic stenosis, AMI, hypertrophic obstructive cardiomyopathy (HOCM), PE, atrial myxoma, pulmonary hypertension, orthostatic hypotension, hypovolemia, drug effect, GB syndrome, micturition, cough, carotid sinus sensitivity, seizure, TIA/CVA, hypoglycemia, vertigo.   Amount and/or Complexity of Data Reviewed Labs: ordered.    Details: CBC with mild anemia with hemoglobin of 10.8 near patient's baseline.  CMP with mild AKI with creatinine of 1.2 increasing.  Baseline near 1, elevated BUN to 35.  UA question for infection.  Troponin mildly elevated 27 in context and BNP elevated at 1700.    Radiology: ordered.    Details: Chest x-ray reassuring, CT head.  Risk Decision regarding hospitalization.   Patient will require admission to the hospital for syncope without prodrome.  At this time she is very well-appearing, asymptomatic.  Consult to hospitalist as above.  Jenniferlynn voiced understanding of her medical evaluation and treatment plan. Each of their questions answered to their expressed satisfaction.  She and her family are amenable to plan for admission at this  time.   This chart was dictated using voice recognition software, Dragon. Despite the best efforts of this provider to proofread and correct errors, errors may still occur which can change documentation meaning.      Final diagnoses:  None    ED Discharge Orders     None          Bobette Pleasant JONELLE DEVONNA 12/12/24 0229    Lenor Hollering, MD 12/12/24 1511  "

## 2024-12-12 ENCOUNTER — Inpatient Hospital Stay (HOSPITAL_COMMUNITY)

## 2024-12-12 ENCOUNTER — Emergency Department (HOSPITAL_COMMUNITY)

## 2024-12-12 DIAGNOSIS — Z905 Acquired absence of kidney: Secondary | ICD-10-CM | POA: Diagnosis not present

## 2024-12-12 DIAGNOSIS — I959 Hypotension, unspecified: Secondary | ICD-10-CM | POA: Diagnosis present

## 2024-12-12 DIAGNOSIS — F0390 Unspecified dementia without behavioral disturbance: Secondary | ICD-10-CM | POA: Diagnosis present

## 2024-12-12 DIAGNOSIS — M8580 Other specified disorders of bone density and structure, unspecified site: Secondary | ICD-10-CM | POA: Diagnosis present

## 2024-12-12 DIAGNOSIS — H9193 Unspecified hearing loss, bilateral: Secondary | ICD-10-CM | POA: Diagnosis present

## 2024-12-12 DIAGNOSIS — I872 Venous insufficiency (chronic) (peripheral): Secondary | ICD-10-CM | POA: Diagnosis present

## 2024-12-12 DIAGNOSIS — I272 Pulmonary hypertension, unspecified: Secondary | ICD-10-CM | POA: Diagnosis present

## 2024-12-12 DIAGNOSIS — R55 Syncope and collapse: Secondary | ICD-10-CM | POA: Insufficient documentation

## 2024-12-12 DIAGNOSIS — Z9049 Acquired absence of other specified parts of digestive tract: Secondary | ICD-10-CM | POA: Diagnosis not present

## 2024-12-12 DIAGNOSIS — I639 Cerebral infarction, unspecified: Secondary | ICD-10-CM | POA: Diagnosis present

## 2024-12-12 DIAGNOSIS — I3139 Other pericardial effusion (noninflammatory): Secondary | ICD-10-CM | POA: Diagnosis present

## 2024-12-12 DIAGNOSIS — Z87891 Personal history of nicotine dependence: Secondary | ICD-10-CM | POA: Diagnosis not present

## 2024-12-12 DIAGNOSIS — Z96652 Presence of left artificial knee joint: Secondary | ICD-10-CM | POA: Diagnosis present

## 2024-12-12 DIAGNOSIS — I447 Left bundle-branch block, unspecified: Secondary | ICD-10-CM | POA: Diagnosis present

## 2024-12-12 DIAGNOSIS — I480 Paroxysmal atrial fibrillation: Secondary | ICD-10-CM | POA: Diagnosis present

## 2024-12-12 DIAGNOSIS — I340 Nonrheumatic mitral (valve) insufficiency: Secondary | ICD-10-CM | POA: Diagnosis present

## 2024-12-12 DIAGNOSIS — E7800 Pure hypercholesterolemia, unspecified: Secondary | ICD-10-CM | POA: Diagnosis present

## 2024-12-12 DIAGNOSIS — I110 Hypertensive heart disease with heart failure: Secondary | ICD-10-CM | POA: Diagnosis present

## 2024-12-12 DIAGNOSIS — N179 Acute kidney failure, unspecified: Secondary | ICD-10-CM | POA: Diagnosis present

## 2024-12-12 DIAGNOSIS — N390 Urinary tract infection, site not specified: Secondary | ICD-10-CM | POA: Diagnosis present

## 2024-12-12 DIAGNOSIS — I428 Other cardiomyopathies: Secondary | ICD-10-CM | POA: Diagnosis present

## 2024-12-12 DIAGNOSIS — E860 Dehydration: Secondary | ICD-10-CM | POA: Diagnosis present

## 2024-12-12 DIAGNOSIS — Z79899 Other long term (current) drug therapy: Secondary | ICD-10-CM | POA: Diagnosis not present

## 2024-12-12 DIAGNOSIS — Z8249 Family history of ischemic heart disease and other diseases of the circulatory system: Secondary | ICD-10-CM | POA: Diagnosis not present

## 2024-12-12 DIAGNOSIS — I5042 Chronic combined systolic (congestive) and diastolic (congestive) heart failure: Secondary | ICD-10-CM | POA: Diagnosis present

## 2024-12-12 DIAGNOSIS — J4520 Mild intermittent asthma, uncomplicated: Secondary | ICD-10-CM | POA: Diagnosis present

## 2024-12-12 LAB — ECHOCARDIOGRAM COMPLETE
AR max vel: 2.08 cm2
AV Peak grad: 10.6 mmHg
Ao pk vel: 1.63 m/s
Area-P 1/2: 1.34 cm2
Calc EF: 46.5 %
Height: 64 in
S' Lateral: 3.5 cm
Single Plane A2C EF: 57.9 %
Single Plane A4C EF: 36.6 %
Weight: 2560.86 [oz_av]

## 2024-12-12 LAB — URINALYSIS, ROUTINE W REFLEX MICROSCOPIC
Bilirubin Urine: NEGATIVE
Glucose, UA: NEGATIVE mg/dL
Hgb urine dipstick: NEGATIVE
Ketones, ur: NEGATIVE mg/dL
Nitrite: NEGATIVE
Protein, ur: NEGATIVE mg/dL
Specific Gravity, Urine: 1.009 (ref 1.005–1.030)
pH: 5 (ref 5.0–8.0)

## 2024-12-12 MED ORDER — AMLODIPINE BESYLATE 5 MG PO TABS
5.0000 mg | ORAL_TABLET | Freq: Once | ORAL | Status: AC
  Administered 2024-12-13: 5 mg via ORAL
  Filled 2024-12-12: qty 1

## 2024-12-12 MED ORDER — ACETAMINOPHEN 325 MG PO TABS: 650.0000 mg | ORAL_TABLET | Freq: Four times a day (QID) | ORAL | Status: DC | PRN

## 2024-12-12 MED ORDER — CARVEDILOL 3.125 MG PO TABS: 6.2500 mg | ORAL_TABLET | Freq: Two times a day (BID) | ORAL | Status: DC

## 2024-12-12 MED ORDER — ACETAMINOPHEN 650 MG RE SUPP: 650.0000 mg | Freq: Four times a day (QID) | RECTAL | Status: DC | PRN

## 2024-12-12 MED ORDER — LACTATED RINGERS IV SOLN: INTRAVENOUS | Status: AC

## 2024-12-12 MED ORDER — OXYCODONE HCL 5 MG PO TABS: 5.0000 mg | ORAL_TABLET | ORAL | Status: DC | PRN

## 2024-12-12 MED ORDER — SIMVASTATIN 20 MG PO TABS
40.0000 mg | ORAL_TABLET | Freq: Every day | ORAL | Status: DC
  Administered 2024-12-12: 40 mg via ORAL
  Filled 2024-12-12: qty 2

## 2024-12-12 MED ORDER — ONDANSETRON HCL 4 MG/2ML IJ SOLN: 4.0000 mg | Freq: Four times a day (QID) | INTRAMUSCULAR | Status: DC | PRN

## 2024-12-12 MED ORDER — ENOXAPARIN SODIUM 30 MG/0.3ML IJ SOSY
30.0000 mg | PREFILLED_SYRINGE | INTRAMUSCULAR | Status: DC
  Administered 2024-12-12 – 2024-12-13 (×2): 30 mg via SUBCUTANEOUS
  Filled 2024-12-12 (×2): qty 0.3

## 2024-12-12 MED ORDER — PERFLUTREN LIPID MICROSPHERE
1.0000 mL | INTRAVENOUS | Status: AC | PRN
  Administered 2024-12-12: 2 mL via INTRAVENOUS

## 2024-12-12 MED ORDER — LISINOPRIL 10 MG PO TABS
30.0000 mg | ORAL_TABLET | Freq: Every day | ORAL | Status: DC
  Administered 2024-12-12: 30 mg via ORAL
  Filled 2024-12-12: qty 3

## 2024-12-12 MED ORDER — HYDRALAZINE HCL 20 MG/ML IJ SOLN
10.0000 mg | Freq: Four times a day (QID) | INTRAMUSCULAR | Status: DC | PRN
  Administered 2024-12-12: 10 mg via INTRAVENOUS
  Filled 2024-12-12: qty 1

## 2024-12-12 MED ORDER — FOSFOMYCIN TROMETHAMINE 3 G PO PACK
3.0000 g | PACK | Freq: Once | ORAL | Status: AC
  Administered 2024-12-12: 3 g via ORAL
  Filled 2024-12-12: qty 3

## 2024-12-12 MED ORDER — ONDANSETRON HCL 4 MG PO TABS: 4.0000 mg | ORAL_TABLET | Freq: Four times a day (QID) | ORAL | Status: DC | PRN

## 2024-12-12 NOTE — H&P (Signed)
 " History and Physical    Rhonda Burke DOB: 1927-07-13 DOA: 12/11/2024  PCP: Windy Coy, MD   Chief Complaint: syncope  HPI: Rhonda Burke is a 89 y.o. female with medical history significant of hypertension, hyperlipidemia who presents Emergency Department after syncopal episode.  Patient was sitting at dinner with family when she suddenly became unresponsive and slumped in her chair.  EMS was called and she was found to be hypotensive.  She regained consciousness and was given IV fluids.  She denied any chest pain or other symptoms.  She endorsed compliance with her home carvedilol  and lisinopril .  She went to the ER where she was found to be afebrile and hemodynamically stable.  Labs were obtained on presentation which showed creatinine 1.2 baseline around 1, bicarb 18, WBC 6.4, hemoglobin 10.8, troponin 27, BNP 1700, urinalysis with possible infection.  Patient was admitted for further workup.   Review of Systems: Review of Systems  Constitutional:  Negative for chills and fever.  HENT: Negative.    Eyes:  Negative for redness.  Respiratory: Negative.    Cardiovascular: Negative.   Gastrointestinal: Negative.   Genitourinary: Negative.   Musculoskeletal: Negative.   Skin: Negative.   All other systems reviewed and are negative.    As per HPI otherwise 10 point review of systems negative.   Allergies[1]  Past Medical History:  Diagnosis Date   Angiomyolipoma    right   Aortic sclerosis    Asthma, mild intermittent    Bilateral hearing loss    Bowel obstruction (HCC)    sigmoid ulceration and obstruction   Breast thickening    right breast   Degenerative disc disease    C-spine   Fibrocystic breast disease    Hypercholesteremia    Hypertension    LBBB (left bundle branch block)    Mitral regurgitation    Osteopenia    Pulmonary hypertension (HCC)    Venous insufficiency     Past Surgical History:  Procedure Laterality Date    APPENDECTOMY  12/2001   BREAST EXCISIONAL BIOPSY Right 1999   CARDIAC CATHETERIZATION  10/02/2007   moderate LV dysfunction, EF 35-40%, conc LVH, normal coronaries, mild pulm HTN (Dr. IVAR Sor)   CATARACT EXTRACTION Bilateral 2004   CHOLECYSTECTOMY  2003   COLECTOMY  12/2001   left sigmoid, obstruction/diverticulitis   COLON SURGERY     bowel obstruction and ulceration   HERNIA REPAIR     NEPHRECTOMY  12/2001   right, angiomyolipoma   NM MYOCAR PERF WALL MOTION  01/2011   dipyridamole myoview; normal pattern of perfusion in all regions, EF 61%, normal, low risk scan    TOTAL KNEE ARTHROPLASTY Left 03/16/2011   Dr Hiram   TRANSTHORACIC ECHOCARDIOGRAM  01/2011   EF 40-45%, mild conc LVH; RV mildly dilated; mild mitral annular calcif, mild MR; mild TR, RSVP elevated at 40-42mmHg (mild pulm htn); AV mildly sclerotic      reports that she has quit smoking. She has never used smokeless tobacco. She reports current alcohol use of about 1.0 standard drink of alcohol per week. She reports that she does not use drugs.  Family History  Problem Relation Age of Onset   Heart disease Mother        also stroke   Heart disease Father        also stroke    Lung cancer Sister    Cancer Sister 10    Prior to Admission medications  Medication Sig Start  Date End Date Taking? Authorizing Provider  Calcium Carbonate-Vit D-Min (CALTRATE 600+D PLUS MINERALS) 600-800 MG-UNIT TABS Take 1 tablet by mouth daily with breakfast.   Yes [provider]  carvedilol  (COREG ) 6.25 MG tablet Take 1.5 tablet (9.375 mg) in the AM, and 1 tablet (6.25 mg) in the PM. 09/18/22  Yes Burnard Debby LABOR, MD  lisinopril  (ZESTRIL ) 20 MG tablet Take 1.5 tablets (30 mg total) by mouth daily. 09/02/19  Yes Burnard Debby LABOR, MD  OVER THE COUNTER MEDICATION Take 1 packet by mouth daily with breakfast. HEALTHYCELL BIOACTIVE DAILY LIQUID MULTIVITAMIN: Drink one gel pack daily with breakfast.   Yes [provider]  OVER THE  COUNTER MEDICATION Take 1 packet by mouth daily with breakfast. HEALTHYCELL EYE HEALTH DAILY LIQUID SUPPLEMENT: DRINK ONE GEL PACKET DAILY WITH BREAKFAST   Yes [provider]  simvastatin  (ZOCOR ) 40 MG tablet Take 40 mg by mouth daily with supper.   Yes [provider]    Physical Exam: Vitals:   12/12/24 0200 12/12/24 0300 12/12/24 0525 12/12/24 0540  BP:  (!) 147/62 (!) 155/89   Pulse: 65 65 72 73  Resp: 14 17 (!) 21 17  Temp:      TempSrc:      SpO2: 99% 98% 100% 100%  Weight:      Height:       Physical Exam Constitutional:      Appearance: She is normal weight.  HENT:     Head: Normocephalic.     Nose: Nose normal.     Mouth/Throat:     Mouth: Mucous membranes are moist.     Pharynx: Oropharynx is clear.  Eyes:     Conjunctiva/sclera: Conjunctivae normal.     Pupils: Pupils are equal, round, and reactive to light.  Cardiovascular:     Rate and Rhythm: Normal rate and regular rhythm.     Pulses: Normal pulses.     Heart sounds: Normal heart sounds.  Pulmonary:     Effort: Pulmonary effort is normal.     Breath sounds: Normal breath sounds.  Abdominal:     General: Abdomen is flat. Bowel sounds are normal.  Musculoskeletal:        General: Normal range of motion.     Cervical back: Normal range of motion.  Skin:    General: Skin is warm.     Capillary Refill: Capillary refill takes less than 2 seconds.  Neurological:     General: No focal deficit present.     Mental Status: She is alert.  Psychiatric:        Mood and Affect: Mood normal.        Labs on Admission: I have personally reviewed the patients's labs and imaging studies.  Assessment/Plan Active Problems:   Syncope, vasovagal   # Syncope - Unclear etiology of syncope - Patient was sitting when she suddenly slumped over - Last echocardiogram was obtained 2023 which showed diastolic dysfunction - On beta-blocker  Plan: Hold carvedilol  Repeat echocardiogram Order  therapy  # Urinary tract infection-give fosfomycin   # Dehydration-continue IV fluids  # Hyperlipidemia-continue simvastatin   # History of heart failure with preserved ejection fraction, not in exacerbation-continue to monitor   Admission status: Inpatient Telemetry  Certification: The appropriate patient status for this patient is INPATIENT. Inpatient status is judged to be reasonable and necessary in order to provide the required intensity of service to ensure the patient's safety. The patient's presenting symptoms, physical exam findings, and initial radiographic and  laboratory data in the context of their chronic comorbidities is felt to place them at high risk for further clinical deterioration. Furthermore, it is not anticipated that the patient will be medically stable for discharge from the hospital within 2 midnights of admission.   * I certify that at the point of admission it is my clinical judgment that the patient will require inpatient hospital care spanning beyond 2 midnights from the point of admission due to high intensity of service, high risk for further deterioration and high frequency of surveillance required.DEWAINE Lamar Dess MD [facility] If 7PM-7AM, please contact night-coverage [email or web address]  12/12/2024, 5:43 AM        [1] No Known Allergies  "

## 2024-12-12 NOTE — Progress Notes (Addendum)
" °  Progress Note   Patient: Rhonda Burke FMW:[id number] DOB: 07/17/1927 DOA: 12/11/2024     0 DOS: the patient was seen and examined on 12/12/2024   Same Day Admission Note:  89 y.o. female with medical history significant of hypertension, hyperlipidemia who presents Emergency Department after syncopal episode.  Patient was sitting at dinner with family when she suddenly became unresponsive and slumped in her chair.  EMS was called and she was found to be hypotensive.   CT head showed no acute abnormalities.  No focal deficits noted.  Spoke with the son at the bedside. He told me about her dementia and inability to maintain adequate hydration.   We spoke about checking her BP and Heart rate prior to giving her coreg  and lisinopril .  She is on IVF.  Orthostatic vitals to be checked.  Holding lisinopril  because of worsening renal function.  F/u BMP in am.  We spoke about getting TTE. Her BNP is high.  Patient will be moved to [place] next month.  Patient lives at home by herself but does have caregivers.  Author: Deliliah Room, MD 12/12/2024 10:27 AM  For on call review [email or web address].  "

## 2024-12-12 NOTE — Evaluation (Signed)
 Physical Therapy Brief Evaluation and Discharge Note Patient Details Name: Rhonda Burke MRN: [id number] DOB: 10-12-1927 Today's Date: 12/12/2024   History of Present Illness  Rhonda Burke is a 89 y.o. female who presents Emergency Department after syncopal episode.  Patient was sitting at dinner with family when she suddenly became unresponsive and slumped in her chair.  EMS was called and she was found to be hypotensive. Past medical history significant of hypertension, hyperlipidemia, dementia.  Clinical Impression  Pt presents with admitting diagnosis above. Pt today was able to ambulate in hallway with RW CGA. Per son in room, pt lives alone however has daily PCA support and was Mod I with RW. Son also stated that pt is set to move into [facility] on January 10th with increased caregiver support. Pt appears to be close to baseline therefore I am ok with deferring PT until pt moves into her new ILF. Pt will sign off at this time. Re consult PT if mobility status changes.        PT Assessment Patient does not need any further PT services  Assistance Needed at Discharge  Frequent or constant Supervision/Assistance    Equipment Recommendations None recommended by PT  Recommendations for Other Services       Precautions/Restrictions Precautions Precautions: Fall Restrictions Weight Bearing Restrictions Per Provider Order: No        Mobility  Bed Mobility     Sit to supine/sidelying: Contact guard assist General bed mobility comments: Son assisting pt back to bed  Transfers Overall transfer level: Needs assistance Equipment used: Rolling walker (2 wheels) Transfers: Sit to/from Stand Sit to Stand: Contact guard assist           General transfer comment: Cues for hand placement    Ambulation/Gait Ambulation/Gait assistance: Contact guard assist Gait Distance (Feet): 75 Feet Assistive device: Rolling walker (2 wheels) Gait Pattern/deviations:  Decreased stride length, Step-through pattern Gait Speed: Pace WFL General Gait Details: Fairly quick gait speed. Cues for proximity to RW.  Home Activity Instructions    Stairs            Modified Rankin (Stroke Patients Only)        Balance Overall balance assessment: Mild deficits observed, not formally tested                        Pertinent Vitals/Pain PT - Brief Vital Signs All Vital Signs Stable: Yes Pain Assessment Pain Assessment: No/denies pain     Home Living Family/patient expects to be discharged to:: Private residence Living Arrangements: Alone       Home Equipment: Agricultural Consultant (2 wheels);Rollator (4 wheels);Cane - single point;Shower seat;Grab bars - toilet;Grab bars - tub/shower;Hand held shower head;Transport chair   Additional Comments: Plans to move in [place] in 2 weeks for ILF    Prior Function Level of Independence: Independent with assistive device(s) Comments: Mod I RW    UE/LE Assessment   UE ROM/Strength/Tone/Coordination: WFL    LE ROM/Strength/Tone/Coordination: [facility]      Communication   Communication Communication: No apparent difficulties     Cognition Overall Cognitive Status: History of cognitive impairments - at baseline Comments: Age related dementia per son     General Comments General comments (skin integrity, edema, etc.): VSS    Exercises     Assessment/Plan    PT Problem List         PT Visit Diagnosis Other abnormalities of gait and mobility (R26.89)  No Skilled PT Patient at baseline level of functioning   Co-evaluation                AMPAC 6 Clicks Help needed turning from your back to your side while in a flat bed without using bedrails?: A Little Help needed moving from lying on your back to sitting on the side of a flat bed without using bedrails?: A Little Help needed moving to and from a bed to a chair (including a wheelchair)?: A Little Help needed standing up  from a chair using your arms (e.g., wheelchair or bedside chair)?: A Little Help needed to walk in hospital room?: A Little Help needed climbing 3-5 steps with a railing? : A Little 6 Click Score: 18      End of Session Equipment Utilized During Treatment: Gait belt Activity Tolerance: Patient tolerated treatment well Patient left: in bed;with call bell/phone within reach;with family/visitor present Nurse Communication: Mobility status PT Visit Diagnosis: Other abnormalities of gait and mobility (R26.89)     Time: 1354-1411 PT Time Calculation (min) (ACUTE ONLY): 17 min  Charges:   PT Evaluation $PT Eval Moderate Complexity: 1 Mod      Yohannes Waibel B, PT, DPT Acute Rehab Services [phone]   Kc Summerson  12/12/2024, 3:30 PM

## 2024-12-12 NOTE — Progress Notes (Signed)
2D echo complete 

## 2024-12-13 DIAGNOSIS — R55 Syncope and collapse: Secondary | ICD-10-CM | POA: Diagnosis not present

## 2024-12-13 LAB — BASIC METABOLIC PANEL WITH GFR
Anion gap: 9 (ref 5–15)
BUN: 27 mg/dL — ABNORMAL HIGH (ref 8–23)
CO2: 21 mmol/L — ABNORMAL LOW (ref 22–32)
Calcium: 9.2 mg/dL (ref 8.9–10.3)
Chloride: 106 mmol/L (ref 98–111)
Creatinine, Ser: 1.08 mg/dL — ABNORMAL HIGH (ref 0.44–1.00)
GFR, Estimated: 47 mL/min — ABNORMAL LOW
Glucose, Bld: 94 mg/dL (ref 70–99)
Potassium: 5.1 mmol/L (ref 3.5–5.1)
Sodium: 136 mmol/L (ref 135–145)

## 2024-12-13 MED ORDER — CARVEDILOL 6.25 MG PO TABS: 6.2500 mg | ORAL_TABLET | Freq: Two times a day (BID) | ORAL | Status: DC

## 2024-12-13 MED ORDER — ENOXAPARIN SODIUM 40 MG/0.4ML IJ SOSY: 40.0000 mg | PREFILLED_SYRINGE | INTRAMUSCULAR | Status: DC

## 2024-12-13 NOTE — Discharge Summary (Signed)
 "  Physician Discharge Summary  Rhonda Burke FMW:[id number] DOB: 24-May-1927 DOA: 12/11/2024  PCP: Windy Coy, MD  Admit date: 12/11/2024 Discharge date: 12/13/2024  Admitted from: Home Discharge disposition: Home  Recommendations at discharge:  For blood pressure control, continue carvedilol .  Stop lisinopril .  Recommend to monitor blood pressure daily at home to target systolic blood pressure less than 150.  Can resume lisinopril  if systolic blood pressure runs over 150. Given her drop in ejection fraction of the heart, I have advised for consistent intake of 40-50 ounces of liquids in 24 hours.  To follow-up with cardiology as an outpatient.  Referral given.  Subjective: Patient was seen and examined this morning.  Pleasant elderly Caucasian female.  Propped up in bed.  Not in distress.  Her son Mr. Bard was at bedside. Chart reviewed.  Blood pressure in the last 24 hours fluctuating between 140s and 180s, breathing on room air I had a long conversation with her son regarding the monitoring of her fluid intake to avoid dehydration or CHF exacerbation  Brief narrative: Rhonda Burke is a 89 y.o. female with PMH significant for dementia, HTN, HLD, degenerative disc disease. Patient lives at home by herself but has caregivers.  Family was in preparation to move her to [facility].  Per family, due to dementia, she has been able to maintain adequate hydration.  Reports compliance to antihypertensives including carvedilol , lisinopril .  12/26, patient presented to the ED with syncopal episode. Patient was sitting at dinner with family when she suddenly became unresponsive and slumped in her chair.  EMS was called and she was found to be hypotensive. She regained consciousness and was given IV fluids. She denied any chest pain or other symptoms.   In the ED, she was found to be afebrile and hemodynamically stable. Labs showed creatinine slightly elevated to 1.4, baseline  around 1, bicarb 18 Urinalysis showed hazy yellow urine with large leukocytes, rare bacteria Patient was admitted for further workup.   Hospital course: Syncope AKI Brought in after patient became suddenly unresponsive and slumped in her chair, noted to be hypotensive. She was under the influence of antihypertensives including carvedilol  and lisinopril .  Family concerned about inadequate hydration to dementia. Creatinine was elevated to 1.4, baseline of 1. Orthostatic blood pressure measurement did not show any significant drop. Inadequate intake and dehydration was likely the cause of syncope.   Given adequate IV hydration Creatinine improving.  Recent Labs    12/11/24 2228 12/11/24 2322 12/13/24 0646  BUN 35* 47* 27*  CREATININE 1.24* 1.40* 1.08*  CO2 18*  --  21*   Systolic CHF Hypertension Previous echo from 2023 had shown an EF of 55 to 60%, G2 DD Repeat echo 12/27 with EF 40 to 45%, global hypokinesis, G1 DD, RV size and function normal, small pericardial effusion, mild to moderate AR. Clinically not in CHF exacerbation.  Was actually given IV fluid given concern of dehydration Given her advanced age, for invasive ischemia workup PTA meds- carvedilol , lisinopril  Blood pressure running elevated in the last 24 hours.  Was given 1 dose of amlodipine  5 mg last night. We have resumed carvedilol  this morning.  I would continue to hold lisinopril .  Recommend to monitor blood pressure daily at home to target systolic blood pressure less than 150.  Can resume lisinopril  if systolic blood pressure runs over 150. Family is concerned about dehydration.  Given her drop in EF, I have advised for consistent intake of 40-50 ounces of liquids in  24 hours.  To follow-up with cardiology as an outpatient.  Urinary tract infection Urinalysis showed hazy yellow urine with large leukocytes, rare bacteria 1 dose of fosfomycin  was given in the hospital   Hyperlipidemia continue  simvastatin    Mobility: Seen by PT.  No follow-up needed   Goals of care   Code Status: Full Code   Diet:  Diet Order             Diet general           Diet regular Room service appropriate? Yes; Fluid consistency: Thin  Diet effective now                   Nutritional status:  Body mass index is 27.47 kg/m.       Wounds:  -    Discharge Medications:   Allergies as of 12/13/2024   No Known Allergies      Medication List     STOP taking these medications    lisinopril  20 MG tablet Commonly known as: ZESTRIL        TAKE these medications    Caltrate 600+D Plus Minerals 600-800 MG-UNIT Tabs Take 1 tablet by mouth daily with breakfast.   carvedilol  6.25 MG tablet Commonly known as: COREG  Take 1.5 tablet (9.375 mg) in the AM, and 1 tablet (6.25 mg) in the PM.   OVER THE COUNTER MEDICATION Take 1 packet by mouth daily with breakfast. HEALTHYCELL BIOACTIVE DAILY LIQUID MULTIVITAMIN: Drink one gel pack daily with breakfast.   OVER THE COUNTER MEDICATION Take 1 packet by mouth daily with breakfast. HEALTHYCELL EYE HEALTH DAILY LIQUID SUPPLEMENT: DRINK ONE GEL PACKET DAILY WITH BREAKFAST   simvastatin  40 MG tablet Commonly known as: ZOCOR  Take 40 mg by mouth daily with supper.         Follow ups:    Follow-up Information     Windy Coy, MD Follow up.   Specialty: Family Medicine Contact information: [address] [place] [address] [place] [address] [phone]                 Discharge Instructions:   Discharge Instructions     Ambulatory referral to Cardiology   Complete by: As directed    Drop in EF noted in echocardiogram.  CHF remains compensated at this time   Call MD for:  difficulty breathing, headache or visual disturbances   Complete by: As directed    Call MD for:  extreme fatigue   Complete by: As directed    Call MD for:  hives   Complete by: As directed    Call MD for:  persistant dizziness or  light-headedness   Complete by: As directed    Call MD for:  persistant nausea and vomiting   Complete by: As directed    Call MD for:  severe uncontrolled pain   Complete by: As directed    Call MD for:  temperature >100.4   Complete by: As directed    Diet general   Complete by: As directed    Discharge instructions   Complete by: As directed    Recommendations at discharge:   For blood pressure control, continue carvedilol .  Stop lisinopril .  Recommend to monitor blood pressure daily at home to target systolic blood pressure less than 150.  Can resume lisinopril  if systolic blood pressure runs over 150.  Given her drop in ejection fraction of the heart, I have advised for consistent intake of 40-50 ounces of liquids in 24 hours.  To follow-up with cardiology as an outpatient.  Referral given  Discharge instructions for CHF Check weight daily -preferably same time every day. Restrict fluid intake to 1200 ml daily Restrict salt intake to less than 2 g daily. Call MD if you have one of the following symptoms 1) 3 pound weight gain in 24 hours or 5 pounds in 1 week  2) swelling in the hands, feet or stomach  3) progressive shortness of breath 4) if you have to sleep on extra pillows at night in order to breathe       General discharge instructions: Follow with Primary MD Windy Coy, MD in 7 days  Please request your PCP  to go over your hospital tests, procedures, radiology results at the follow up. Please get your medicines reviewed and adjusted.  Your PCP may decide to repeat certain labs or tests as needed. Do not drive, operate heavy machinery, perform activities at heights, swimming or participation in water activities or provide baby sitting services if your were admitted for syncope or siezures until you have seen by Primary MD or a Neurologist and advised to do so again. [place]  Controlled Substance Reporting System database was reviewed. Do not drive, operate  heavy machinery, perform activities at heights, swim, participate in water activities or provide baby-sitting services while on medications for pain, sleep and mood until your outpatient physician has reevaluated you and advised to do so again.  You are strongly recommended to comply with the dose, frequency and duration of prescribed medications. Activity: As tolerated with Full fall precautions use walker/cane & assistance as needed Avoid using any recreational substances like cigarette, tobacco, alcohol, or non-prescribed drug. If you experience worsening of your admission symptoms, develop shortness of breath, life threatening emergency, suicidal or homicidal thoughts you must seek medical attention immediately by calling 911 or calling your MD immediately  if symptoms less severe. You must read complete instructions/literature along with all the possible adverse reactions/side effects for all the medicines you take and that have been prescribed to you. Take any new medicine only after you have completely understood and accepted all the possible adverse reactions/side effects.  Wear Seat belts while driving. You were cared for by a hospitalist during your hospital stay. If you have any questions about your discharge medications or the care you received while you were in the hospital after you are discharged, you can call the unit and ask to speak with the hospitalist or the covering physician. Once you are discharged, your primary care physician will handle any further medical issues. Please note that NO REFILLS for any discharge medications will be authorized once you are discharged, as it is imperative that you return to your primary care physician (or establish a relationship with a primary care physician if you do not have one).   Increase activity slowly   Complete by: As directed        Discharge Exam:   Vitals:   12/13/24 0118 12/13/24 0500 12/13/24 0730 12/13/24 1202  BP: (!) 167/75 (!)  140/59 (!) 147/67 114/68  Pulse: 75 72 73 73  Resp: 17 17 15 16   Temp: 97.6 F (36.4 C) (!) 97.5 F (36.4 C) 98.3 F (36.8 C) 98.5 F (36.9 C)  TempSrc: Oral Oral Oral Oral  SpO2: 98% 96% 96% 98%  Weight:      Height:        Body mass index is 27.47 kg/m.  General exam: Pleasant, elderly Caucasian female.  Not in  distress Skin: No rashes, lesions or ulcers. HEENT: Atraumatic, normocephalic, no obvious bleeding Lungs: Clear to auscultation bilaterally,  CVS: S1, S2, no murmur,   GI/Abd: Soft, nontender, nondistended, bowel sound present,   CNS: Awake, awake, oriented x 3.  Hard of hearing Psychiatry: Mood appropriate Extremities: No pedal edema, no calf tenderness,    The results of significant diagnostics from this hospitalization (including imaging, microbiology, ancillary and laboratory) are listed below for reference.    Procedures and Diagnostic Studies:   ECHOCARDIOGRAM COMPLETE Result Date: 12/12/2024    ECHOCARDIOGRAM REPORT   Patient Name:   Rhonda Burke Date of Exam: 12/12/2024 Medical Rec #:  [id number]          Height:       64.0 in Accession #:    [id number]         Weight:       160.1 lb Date of Birth:  [birth year]          BSA:          1.780 m Patient Age:    89 years           BP:           126/45 mmHg Patient Gender: F                  HR:           63 bpm. Exam Location:  Inpatient Procedure: 2D Echo, Color Doppler, Cardiac Doppler and Intracardiac            Opacification Agent (Both Spectral and Color Flow Doppler were            utilized during procedure). Indications:    Syncope R55  History:        Patient has prior history of Echocardiogram examinations, most                 recent 08/06/2022. Mitral valve regurgitation;                 Signs/Symptoms:Hypertensive Heart Disease.  Sonographer:    Sydnee Wilson RDCS Referring Phys: [id number] ROBERT DORRELL IMPRESSIONS  1. Left ventricular ejection fraction, by estimation, is 40 to 45%. The left ventricle has  mildly decreased function. The left ventricle demonstrates global hypokinesis. There is mild left ventricular hypertrophy. Left ventricular diastolic parameters are consistent with Grade I diastolic dysfunction (impaired relaxation).  2. Right ventricular systolic function is normal. The right ventricular size is normal. There is normal pulmonary artery systolic pressure. The estimated right ventricular systolic pressure is 35.9 mmHg.  3. A small pericardial effusion is present. The pericardial effusion is circumferential. There is no evidence of cardiac tamponade.  4. The mitral valve is normal in structure. Mild mitral valve regurgitation. No evidence of mitral stenosis.  5. Tricuspid valve regurgitation is moderate.  6. The aortic valve is tricuspid. There is mild calcification of the aortic valve. There is mild thickening of the aortic valve. Aortic valve regurgitation is mild to moderate. Aortic valve sclerosis is present, with no evidence of aortic valve stenosis.  7. The inferior vena cava is normal in size with greater than 50% respiratory variability, suggesting right atrial pressure of 3 mmHg. FINDINGS  Left Ventricle: Left ventricular ejection fraction, by estimation, is 40 to 45%. The left ventricle has mildly decreased function. The left ventricle demonstrates global hypokinesis. The left ventricular internal cavity size was normal in size. There is  mild left ventricular hypertrophy. Left ventricular diastolic  parameters are consistent with Grade I diastolic dysfunction (impaired relaxation). Right Ventricle: The right ventricular size is normal. No increase in right ventricular wall thickness. Right ventricular systolic function is normal. There is normal pulmonary artery systolic pressure. The tricuspid regurgitant velocity is 2.87 m/s, and  with an assumed right atrial pressure of 3 mmHg, the estimated right ventricular systolic pressure is 35.9 mmHg. Left Atrium: Left atrial size was normal in  size. Right Atrium: Right atrial size was normal in size. Pericardium: A small pericardial effusion is present. The pericardial effusion is circumferential. There is no evidence of cardiac tamponade. Mitral Valve: The mitral valve is normal in structure. Mild mitral valve regurgitation. No evidence of mitral valve stenosis. Tricuspid Valve: The tricuspid valve is normal in structure. Tricuspid valve regurgitation is moderate . No evidence of tricuspid stenosis. Aortic Valve: The aortic valve is tricuspid. There is mild calcification of the aortic valve. There is mild thickening of the aortic valve. Aortic valve regurgitation is mild to moderate. Aortic valve sclerosis is present, with no evidence of aortic valve stenosis. Aortic valve peak gradient measures 10.6 mmHg. Pulmonic Valve: The pulmonic valve was normal in structure. Pulmonic valve regurgitation is not visualized. No evidence of pulmonic stenosis. Aorta: The aortic root is normal in size and structure. Venous: The inferior vena cava is normal in size with greater than 50% respiratory variability, suggesting right atrial pressure of 3 mmHg. IAS/Shunts: No atrial level shunt detected by color flow Doppler.  LEFT VENTRICLE PLAX 2D LVIDd:         4.60 cm      Diastology LVIDs:         3.50 cm      LV e' medial:    4.24 cm/s LV PW:         1.20 cm      LV E/e' medial:  12.7 LV IVS:        1.10 cm      LV e' lateral:   4.35 cm/s LVOT diam:     2.00 cm      LV E/e' lateral: 12.4 LV SV:         91 LV SV Index:   51 LVOT Area:     3.14 cm  LV Volumes (MOD) LV vol d, MOD A2C: 123.0 ml LV vol d, MOD A4C: 97.2 ml LV vol s, MOD A2C: 51.8 ml LV vol s, MOD A4C: 61.6 ml LV SV MOD A2C:     71.2 ml LV SV MOD A4C:     97.2 ml LV SV MOD BP:      50.6 ml RIGHT VENTRICLE RV S prime:     10.60 cm/s TAPSE (M-mode): 2.6 cm LEFT ATRIUM             Index        RIGHT ATRIUM           Index LA diam:        3.20 cm 1.80 cm/m   RA Area:     21.25 cm LA Vol (A2C):   73.2 ml 41.13  ml/m  RA Volume:   58.75 ml  33.01 ml/m LA Vol (A4C):   27.6 ml 15.51 ml/m LA Biplane Vol: 45.6 ml 25.62 ml/m  AORTIC VALVE AV Area (Vmax): 2.08 cm AV Vmax:        163.00 cm/s AV Peak Grad:   10.6 mmHg LVOT Vmax:      108.00 cm/s LVOT Vmean:     75.000  cm/s LVOT VTI:       0.290 m  AORTA Ao Root diam: 2.80 cm Ao Asc diam:  3.20 cm MITRAL VALVE                TRICUSPID VALVE MV Area (PHT): 1.34 cm     TR Peak grad:   32.9 mmHg MV Decel Time: 567 msec     TR Vmax:        287.00 cm/s MV E velocity: 54.00 cm/s MV A velocity: 104.00 cm/s  SHUNTS MV E/A ratio:  0.52         Systemic VTI:  0.29 m                             Systemic Diam: 2.00 cm Oneil Parchment MD Electronically signed by Oneil Parchment MD Signature Date/Time: 12/12/2024/1:10:32 PM    Final    CT Head Wo Contrast Result Date: 12/12/2024 EXAM: CT HEAD WITHOUT CONTRAST 12/12/2024 12:39:47 AM TECHNIQUE: CT of the head was performed without the administration of intravenous contrast. Automated exposure control, iterative reconstruction, and/or weight based adjustment of the mA/kV was utilized to reduce the radiation dose to as low as reasonably achievable. COMPARISON: None available. CLINICAL HISTORY: Syncope/presyncope, cerebrovascular cause suspected. FINDINGS: BRAIN AND VENTRICLES: No acute hemorrhage. No evidence of acute infarct. No hydrocephalus. No extra-axial collection. No mass effect or midline shift. Age related atrophy. Mild periventricular and deep white matter hypodensity typical of chronic small vessel ischemia. ORBITS: Bilateral cataract resection. SINUSES: No acute abnormality. SOFT TISSUES AND SKULL: No acute soft tissue abnormality. No skull fracture. VASCULATURE: Atherosclerosis of skullbase vasculature without hyperdense vessel or abnormal calcification. IMPRESSION: 1. No acute intracranial abnormality. 2. Age related atrophy and mild periventricular and deep white matter hypodensity typical of chronic small vessel ischemia. 3.  Atherosclerosis of the skull base vasculature. 4. Bilateral cataract resection. Electronically signed by: Dorethia Molt MD 12/12/2024 12:53 AM EST RP Workstation: [id number]   DG Chest 2 View Result Date: 12/12/2024 EXAM: 2 VIEW(S) XRAY OF THE CHEST 12/12/2024 12:21:21 AM COMPARISON: 12/11/2024 CLINICAL HISTORY: ? pleural effusion, CHF FINDINGS: LINES, TUBES AND DEVICES: Multiple monitoring devices noted. LUNGS AND PLEURA: No focal pulmonary opacity. No pleural effusion. No pneumothorax. HEART AND MEDIASTINUM: Stable cardiomegaly. Aortic atherosclerosis. BONES AND SOFT TISSUES: Thoracic spondylosis. IMPRESSION: 1. No acute cardiopulmonary abnormality. 2. Stable cardiomegaly and aortic atherosclerosis. Electronically signed by: Greig Pique MD 12/12/2024 12:29 AM EST RP Workstation: [id number]   DG Chest Port 1 View Result Date: 12/11/2024 EXAM: 1 VIEW(S) XRAY OF THE CHEST 12/11/2024 10:48:47 PM COMPARISON: Chest x-ray 02/19/2023, CT chest 11/07/2015. CLINICAL HISTORY: syncope FINDINGS: LUNGS AND PLEURA: Left retrocardiac airspace opacity with possible associated pleural effusion. Reticulonodular opacities of the right lower lung zone. Chronic interstitial markings. No pneumothorax. HEART AND MEDIASTINUM: Prominent cardiac silhouette likely due to AP portable technique. Cardiomeediastinal silhouette is otherwise unremarkable. Calcified aorta. BONES AND SOFT TISSUES: Thoracic degenerative changes. IMPRESSION: 1. Left retrocardiac airspace opacity with possible pleural effusion. Recommend repeat PA and lateral view of the chest for further evaluation. 2. Reticulonodular opacities in the right lower lung zone. Electronically signed by: Kate Plummer MD 12/11/2024 11:24 PM EST RP Workstation: [id number]     Labs:   Basic Metabolic Panel: Recent Labs  Lab 12/11/24 2228 12/11/24 2322 12/13/24 0646  NA 137 138 136  K 4.9 5.3* 5.1  CL 107 108 106  CO2 18*  --  21*  GLUCOSE 125* 101*  94  BUN 35* 47*  27*  CREATININE 1.24* 1.40* 1.08*  CALCIUM 8.8*  --  9.2   GFR Estimated Creatinine Clearance: 30.4 mL/min (A) (by C-G formula based on SCr of 1.08 mg/dL (H)). Liver Function Tests: Recent Labs  Lab 12/11/24 2228  AST 27  ALT 11  ALKPHOS 48  BILITOT <0.2  PROT 5.7*  ALBUMIN 3.2*   No results for input(s): LIPASE, AMYLASE in the last 168 hours. No results for input(s): AMMONIA in the last 168 hours. Coagulation profile No results for input(s): INR, PROTIME in the last 168 hours.  CBC: Recent Labs  Lab 12/11/24 2228 12/11/24 2322  WBC 6.4  --   NEUTROABS 2.7  --   HGB 10.8* 11.2*  HCT 32.8* 33.0*  MCV 100.3*  --   PLT 259  --    Cardiac Enzymes: No results for input(s): CKTOTAL, CKMB, CKMBINDEX, TROPONINI in the last 168 hours. BNP: Invalid input(s): POCBNP CBG: Recent Labs  Lab 12/11/24 2231  GLUCAP 133*   D-Dimer No results for input(s): DDIMER in the last 72 hours. Hgb A1c No results for input(s): HGBA1C in the last 72 hours. Lipid Profile No results for input(s): CHOL, HDL, LDLCALC, TRIG, CHOLHDL, LDLDIRECT in the last 72 hours. Thyroid  function studies No results for input(s): TSH, T4TOTAL, T3FREE, THYROIDAB in the last 72 hours.  Invalid input(s): FREET3 Anemia work up No results for input(s): VITAMINB12, FOLATE, FERRITIN, TIBC, IRON, RETICCTPCT in the last 72 hours. Microbiology No results found for this or any previous visit (from the past 240 hours).  Time coordinating discharge: 45 minutes  Signed: Phu Record  [facility] 12/13/2024, 1:50 PM   "

## 2024-12-13 NOTE — Progress Notes (Incomplete)
 Pt daughter said her brother would be here this morning

## 2024-12-13 NOTE — Plan of Care (Signed)
   Problem: Education: Goal: Knowledge of General Education information will improve Description Including pain rating scale, medication(s)/side effects and non-pharmacologic comfort measures Outcome: Progressing   Problem: Activity: Goal: Risk for activity intolerance will decrease Outcome: Progressing   Problem: Safety: Goal: Ability to remain free from injury will improve Outcome: Progressing

## 2024-12-13 NOTE — Progress Notes (Signed)
 Pt was discharged to home accompanied by the son and nurse tech.

## 2024-12-13 NOTE — Progress Notes (Signed)
 Discharge   Patient's son expressed verbal understanding of discharge POC.   Patient's son given time to ask any questions.  Additional education included in AVS.  Alert and  in good spirits.   Tele and PIV removed by primaryRN  Pressure dressings intact.  Discharging to [facility].

## 2024-12-21 ENCOUNTER — Encounter: Payer: Self-pay | Admitting: Internal Medicine

## 2024-12-21 ENCOUNTER — Ambulatory Visit: Attending: Internal Medicine | Admitting: Internal Medicine

## 2024-12-21 VITALS — BP 112/54 | HR 62 | Ht 64.0 in | Wt 147.5 lb

## 2024-12-21 DIAGNOSIS — I447 Left bundle-branch block, unspecified: Secondary | ICD-10-CM | POA: Diagnosis not present

## 2024-12-21 DIAGNOSIS — I959 Hypotension, unspecified: Secondary | ICD-10-CM | POA: Diagnosis not present

## 2024-12-21 DIAGNOSIS — I10 Essential (primary) hypertension: Secondary | ICD-10-CM | POA: Insufficient documentation

## 2024-12-21 DIAGNOSIS — R55 Syncope and collapse: Secondary | ICD-10-CM | POA: Insufficient documentation

## 2024-12-21 DIAGNOSIS — I3139 Other pericardial effusion (noninflammatory): Secondary | ICD-10-CM | POA: Diagnosis not present

## 2024-12-21 DIAGNOSIS — I5020 Unspecified systolic (congestive) heart failure: Secondary | ICD-10-CM | POA: Diagnosis not present

## 2024-12-21 DIAGNOSIS — I428 Other cardiomyopathies: Secondary | ICD-10-CM | POA: Diagnosis not present

## 2024-12-21 MED ORDER — METOPROLOL SUCCINATE ER 25 MG PO TB24: 25.0000 mg | ORAL_TABLET | Freq: Every day | ORAL | 6 refills | Status: AC

## 2024-12-21 NOTE — Progress Notes (Signed)
 " Cardiology Office Note:  .   Date:  12/21/2024  ID:  Rhonda Burke, DOB [birth year], MRN [id number] PCP: Windy Coy, MD  [facility] Providers Cardiologist:  Emeline FORBES Calender, DO    History of Present Illness: .   Rhonda Burke is a 89 y.o. female Discussed the use of AI scribe software for clinical note transcription with the patient, who gave verbal consent to proceed.  History of Present Illness Rhonda Burke is a 89 year old female with heart failure and left bundle branch block who presents for hospital follow-up after a syncopal episode. She is accompanied by her daughter, Darice Cedar. She is a previous patient of [facility].  Syncope and recent hospitalization with UTI - Hospitalized from December 26th to December 28th for a syncopal episode while sitting at dinner - Found unresponsive and hypotensive by EMS; regained consciousness with IV fluids - Afebrile and hemodynamically stable after resuscitation - Creatinine 1.4, bicarbonate 18 during hospitalization - Orthostatic blood pressure tests negative - Treated for urinary tract infection with fosfomycin   History of heart failure with reduced ejection fraction, initially 35% in 2008 - Echocardiogram on December 12, 2024 showed ejection fraction improved to 40-45% - On carvedilol  9.375 mg in the morning and 6.25 mg in the evening - EKG in hospital showed normal sinus rhythm with left bundle branch block - Troponin minimally elevated at 27 - No current chest pain, shortness of breath, lower extremity edema, or lightheadedness  Blood pressure and arrhythmia management - History of hypertension and supraventricular tachycardia - Lisinopril  discontinued after recent syncopal episode due to low blood pressure - Daily blood pressure monitoring since discharge - Recent blood pressure readings: 94/57 with heart rate 78 on December 29th, 89/65 on January 2nd  Hyperlipidemia - History of hyperlipidemia -  Currently taking simvastatin  40 mg daily  Renal and surgical history - 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        Results Labs Creatinine (12/11/2024): 1.4 Bicarbonate (12/11/2024): 18 Troponin (12/11/2024): 27, minimally elevated Urinalysis (12/11/2024): Consistent with mild urinary tract infection  Diagnostic Echocardiogram (12/12/2024): Ejection fraction 40-45%, global hypokinesis, grade 1 diastolic dysfunction, PASP 36 mmHg, small circumferential pericardial effusion without tamponade EKG (12/11/2024): Normal sinus rhythm with left bundle branch block Risk Assessment/Calculations:             Physical Exam:   VS:  BP (!) 112/54 (BP Location: Right Arm, Patient Position: Sitting, Cuff Size: Normal)   Pulse 62   Ht 5' 4 (1.626 m)   Wt 147 lb 8 oz (66.9 kg)   SpO2 96%   BMI 25.32 kg/m    Wt Readings from Last 3 Encounters:  12/21/24 147 lb 8 oz (66.9 kg)  12/11/24 160 lb 0.9 oz (72.6 kg)  08/04/24 160 lb (72.6 kg)    GEN: Well nourished, well developed in no acute distress NECK: No JVD; No carotid bruits CARDIAC: Regular rhythm with extrasystoles, no murmurs, no rubs, no gallops RESPIRATORY:  Clear to auscultation without rales, wheezing or rhonchi  ABDOMEN: Soft, non-tender, non-distended EXTREMITIES:  No edema; No deformity   ASSESSMENT AND PLAN: .    Assessment and Plan Assessment & Plan Syncope and hypotension Recent syncopal episode on December 26th, 2024, associated with hypotension (105/47 mmHg). Possible contributing factors include  dehydration, heat, and mild UTI. Lisinopril  discontinued due to low blood pressure readings. Carvedilol  may contribute to hypotension. - Discontinued lisinopril . - Switched carvedilol  to metoprolol  to reduce blood pressure effect while  maintaining heart rate control. - Encouraged increased fluid intake to prevent dehydration.  Heart failure with reduced ejection fraction (EF 40-45%) Heart failure with reduced ejection fraction, EF 40-45% as of December 27th, 2024. Global hypokinesis and grade 1 diastolic dysfunction noted on echocardiogram. - Switched carvedilol  to metoprolol  for heart rate control and heart function support. - Off lisinopril  due to hypotension  Supraventricular tachycardia Current management includes beta-blocker therapy. - Switched carvedilol  to metoprolol  for heart rate control.  Left bundle branch block  History of pulmonary hypertension PASP of 36 mmHg on recent echocardiogram. No acute symptoms reported.  Pericardial effusion Small pericardial effusion noted on recent echocardiogram, circumferential without tamponade. No acute symptoms reported. - Will repeat limited echocardiogram to monitor pericardial effusion.  Essential hypertension Hypertension management complicated by recent hypotensive episodes. Lisinopril  discontinued due to low blood pressure readings. - Discontinued lisinopril . - Switched carvedilol  to metoprolol  to manage blood pressure and heart rate.  Hyperlipidemia Managed with simvastatin  40 mg daily. - Continue simvastatin  40 mg daily.            Follow up: 1 month with APP and myself in 6 months  Signed, Merial Moritz E Caysie Minnifield, DO  12/21/2024 3:00 PM    [facility] "

## 2024-12-21 NOTE — Patient Instructions (Addendum)
 Medication Instructions:  Your physician has recommended you make the following change in your medication:  Stop Carvedilol  Start Metoprolol  Succinate 25 mg by mouth daily   *If you need a refill on your cardiac medications before your next appointment, please call your pharmacy*  Lab Work: none If you have labs (blood work) drawn today and your tests are completely normal, you will receive your results only by: MyChart Message (if you have MyChart) OR A paper copy in the mail If you have any lab test that is abnormal or we need to change your treatment, we will call you to review the results.  Testing/Procedures: Your physician has requested that you have a limited echocardiogram. Echocardiography is a painless test that uses sound waves to create images of your heart. It provides your doctor with information about the size and shape of your heart and how well your hearts chambers and valves are working. This procedure takes approximately one hour. There are no restrictions for this procedure. Please do NOT wear cologne, perfume, aftershave, or lotions (deodorant is allowed). Please arrive 15 minutes prior to your appointment time.  Please note: We ask at that you not bring children with you during ultrasound (echo/ vascular) testing. Due to room size and safety concerns, children are not allowed in the ultrasound rooms during exams. Our front office staff cannot provide observation of children in our lobby area while testing is being conducted. An adult accompanying a patient to their appointment will only be allowed in the ultrasound room at the discretion of the ultrasound technician under special circumstances. We apologize for any inconvenience.   Follow-Up: At [facility], you and your health needs are our priority.  As part of our continuing mission to provide you with exceptional heart care, our providers are all part of one team.  This team includes your primary  Cardiologist (physician) and Advanced Practice Providers or APPs (Physician Assistants and Nurse Practitioners) who all work together to provide you with the care you need, when you need it.  Your next appointment:   1 month(s)  Provider:   One of our Advanced Practice Providers (APPs): Morse Clause, PA-C  Lamarr Satterfield, NP Miriam Shams, NP  Olivia Pavy, PA-C Josefa Beauvais, NP  Leontine Salen, PA-C Orren Fabry, PA-C  [place], PA-C Ernest Dick, NP  Damien Braver, NP Jon Hails, PA-C  Waddell Donath, PA-C    Dayna Dunn, PA-C  Scott Weaver, PA-C Lum Louis, NP Katlyn West, NP Callie Goodrich, PA-C  Xika Zhao, NP Sheng Haley, PA-C    Kathleen Johnson, PA-C   Then, Jared E Segal, DO will plan to see you again in 6 month(s).    We recommend signing up for the patient portal called MyChart.  Sign up information is provided on this After Visit Summary.  MyChart is used to connect with patients for Virtual Visits (Telemedicine).  Patients are able to view lab/test results, encounter notes, upcoming appointments, etc.  Non-urgent messages can be sent to your provider as well.   To learn more about what you can do with MyChart, go to [email or web address].   Other Instructions   Continue to monitor blood pressure at home

## 2024-12-28 ENCOUNTER — Telehealth: Payer: Self-pay | Admitting: Internal Medicine

## 2024-12-28 NOTE — Telephone Encounter (Signed)
 Pt c/o medication issue:  1. Name of Medication:   metoprolol  succinate (TOPROL  XL) 25 MG 24 hr tablet   2. How are you currently taking this medication (dosage and times per day)?   As prescribed  3. Are you having a reaction (difficulty breathing--STAT)?   4. What is your medication issue?   Daughter Elray) stated patient has been taking this medication as prescribed but last night patient's son inadvertently gave patient carvedilol  (COREG ) 6.25 MG tablet   which had been stopped.  Daughter stated had passed out while sitting at the table.  Daughter is concerned patient may have had a reaction to the two medications and has currently taken patient to her PCP.  Daughter wants a call back regarding this.

## 2024-12-28 NOTE — Telephone Encounter (Signed)
 I called and spoke with the patient's daughter, Darice, who corroborated the same message as discussed with nursing earlier today.    This was likely due to unintentional beta-blocker overuse from taking metoprolol  and accidentally carvedilol  later that day.  She was advised to get rid of the carvedilol  and continue solely on metoprolol  succinate as she was tolerating this for a few days prior.  No need for a cardiac monitor at this time unless it happens again.  Thank you, Emeline Calender, DO

## 2024-12-28 NOTE — Telephone Encounter (Signed)
 Spoke with Darice. Darice states her brother gave Pt coreg  6.25 mg last night . Pt did take toprol  xl 25mg  yesterday morning. Pt had dinner at [facility] and pasted out after having dinner. Darice does not have bp cuff at her home so no BP was not taken at that time. Pt did not take medications this morning. Pt was seen by PCP this morning (they are at PCP during this call) pts BP was back to normal and PCP is suggesting a monitor. Darice was advised to take BP tonight before restarting toprol  xl and that we would send message to Dr Kriste about possible monitor. Darice stated understanding.

## 2024-12-30 ENCOUNTER — Ambulatory Visit (HOSPITAL_COMMUNITY)
Admission: RE | Admit: 2024-12-30 | Discharge: 2024-12-30 | Disposition: A | Discharge status: Home / Self Care | Source: Ambulatory Visit | Attending: Cardiology | Admitting: Cardiology

## 2024-12-30 DIAGNOSIS — I3139 Other pericardial effusion (noninflammatory): Secondary | ICD-10-CM | POA: Insufficient documentation

## 2024-12-30 LAB — ECHOCARDIOGRAM LIMITED
Area-P 1/2: 2.04 cm2
S' Lateral: 3.4 cm

## 2024-12-31 ENCOUNTER — Ambulatory Visit: Payer: Self-pay | Admitting: Internal Medicine

## 2025-01-22 ENCOUNTER — Ambulatory Visit (HOSPITAL_COMMUNITY)

## 2025-02-01 ENCOUNTER — Ambulatory Visit: Admitting: Emergency Medicine

## 2025-03-03 ENCOUNTER — Ambulatory Visit: Admitting: Podiatry
